# Patient Record
Sex: Female | Born: 2000 | Race: Black or African American | Hispanic: No | Marital: Single | Smoking: Never smoker
Health system: Southern US, Community
[De-identification: ages and names within clinical notes are randomized; demographics above are authoritative.]

## PROBLEM LIST (undated history)

## (undated) DIAGNOSIS — R569 Unspecified convulsions: Secondary | ICD-10-CM

## (undated) DIAGNOSIS — J45909 Unspecified asthma, uncomplicated: Secondary | ICD-10-CM

## (undated) DIAGNOSIS — B009 Herpesviral infection, unspecified: Secondary | ICD-10-CM

## (undated) HISTORY — DX: Herpesviral infection, unspecified: B00.9

---

## 2019-01-20 DIAGNOSIS — Z9289 Personal history of other medical treatment: Secondary | ICD-10-CM | POA: Insufficient documentation

## 2021-04-10 ENCOUNTER — Emergency Department: Payer: Medicaid Other | Admitting: Anesthesiology

## 2021-04-10 ENCOUNTER — Encounter
Admission: EM | Disposition: A | Discharge status: Home / Self Care | Payer: Self-pay | Source: Home / Self Care | Attending: Emergency Medicine

## 2021-04-10 ENCOUNTER — Encounter: Payer: Self-pay | Admitting: Anesthesiology

## 2021-04-10 ENCOUNTER — Emergency Department: Payer: Medicaid Other

## 2021-04-10 ENCOUNTER — Encounter: Payer: Self-pay | Admitting: Emergency Medicine

## 2021-04-10 ENCOUNTER — Other Ambulatory Visit: Payer: Self-pay

## 2021-04-10 ENCOUNTER — Ambulatory Visit
Admission: EM | Admit: 2021-04-10 | Discharge: 2021-04-10 | Disposition: A | Discharge status: Home / Self Care | Payer: Medicaid Other | Attending: Emergency Medicine | Admitting: Emergency Medicine

## 2021-04-10 DIAGNOSIS — K661 Hemoperitoneum: Secondary | ICD-10-CM | POA: Diagnosis not present

## 2021-04-10 DIAGNOSIS — R1030 Lower abdominal pain, unspecified: Secondary | ICD-10-CM | POA: Diagnosis present

## 2021-04-10 DIAGNOSIS — O00109 Unspecified tubal pregnancy without intrauterine pregnancy: Secondary | ICD-10-CM | POA: Diagnosis present

## 2021-04-10 DIAGNOSIS — R102 Pelvic and perineal pain: Secondary | ICD-10-CM

## 2021-04-10 DIAGNOSIS — O00102 Left tubal pregnancy without intrauterine pregnancy: Secondary | ICD-10-CM | POA: Diagnosis not present

## 2021-04-10 DIAGNOSIS — Z20822 Contact with and (suspected) exposure to covid-19: Secondary | ICD-10-CM | POA: Diagnosis not present

## 2021-04-10 DIAGNOSIS — O00202 Left ovarian pregnancy without intrauterine pregnancy: Secondary | ICD-10-CM

## 2021-04-10 DIAGNOSIS — N838 Other noninflammatory disorders of ovary, fallopian tube and broad ligament: Secondary | ICD-10-CM | POA: Insufficient documentation

## 2021-04-10 DIAGNOSIS — O009 Unspecified ectopic pregnancy without intrauterine pregnancy: Secondary | ICD-10-CM

## 2021-04-10 DIAGNOSIS — Z8719 Personal history of other diseases of the digestive system: Secondary | ICD-10-CM

## 2021-04-10 HISTORY — DX: Unspecified convulsions: R56.9

## 2021-04-10 HISTORY — DX: Unspecified asthma, uncomplicated: J45.909

## 2021-04-10 HISTORY — PX: DIAGNOSTIC LAPAROSCOPY WITH REMOVAL OF ECTOPIC PREGNANCY: SHX6449

## 2021-04-10 HISTORY — DX: Personal history of other diseases of the digestive system: Z87.19

## 2021-04-10 LAB — CBC
HCT: 30 % — ABNORMAL LOW (ref 36.0–46.0)
Hemoglobin: 8.8 g/dL — ABNORMAL LOW (ref 12.0–15.0)
MCH: 22.1 pg — ABNORMAL LOW (ref 26.0–34.0)
MCHC: 29.3 g/dL — ABNORMAL LOW (ref 30.0–36.0)
MCV: 75.2 fL — ABNORMAL LOW (ref 80.0–100.0)
Platelets: 290 10*3/uL (ref 150–400)
RBC: 3.99 MIL/uL (ref 3.87–5.11)
RDW: 17.6 % — ABNORMAL HIGH (ref 11.5–15.5)
WBC: 8.4 10*3/uL (ref 4.0–10.5)
nRBC: 0 % (ref 0.0–0.2)

## 2021-04-10 LAB — BASIC METABOLIC PANEL
Anion gap: 5 (ref 5–15)
BUN: 10 mg/dL (ref 6–20)
CO2: 23 mmol/L (ref 22–32)
Calcium: 9.2 mg/dL (ref 8.9–10.3)
Chloride: 105 mmol/L (ref 98–111)
Creatinine, Ser: 0.49 mg/dL (ref 0.44–1.00)
GFR, Estimated: >60 mL/min (ref >60)
Glucose, Bld: 95 mg/dL (ref 70–99)
Potassium: 3.4 mmol/L — ABNORMAL LOW (ref 3.5–5.1)
Sodium: 133 mmol/L — ABNORMAL LOW (ref 135–145)

## 2021-04-10 LAB — POC URINE PREG, ED: Preg Test, Ur: POSITIVE — AB

## 2021-04-10 LAB — ABO/RH: ABO/RH(D): AB POS

## 2021-04-10 LAB — TYPE AND SCREEN
ABO/RH(D): AB POS
Antibody Screen: NEGATIVE

## 2021-04-10 LAB — URINALYSIS, COMPLETE (UACMP) WITH MICROSCOPIC
Bilirubin Urine: NEGATIVE
Glucose, UA: NEGATIVE mg/dL
Hgb urine dipstick: NEGATIVE
Leukocytes,Ua: NEGATIVE
Nitrite: NEGATIVE
Protein, ur: 30 mg/dL — AB
Specific Gravity, Urine: >1.03 — ABNORMAL HIGH (ref 1.005–1.030)
Squamous Epithelial / HPF: >50 (ref 0–5)
pH: 5.5 (ref 5.0–8.0)

## 2021-04-10 LAB — RESP PANEL BY RT-PCR (FLU A&B, COVID) ARPGX2
Influenza A by PCR: NEGATIVE
Influenza B by PCR: NEGATIVE
SARS Coronavirus 2 by RT PCR: NEGATIVE

## 2021-04-10 LAB — HCG, QUANTITATIVE, PREGNANCY: hCG, Beta Chain, Quant, S: 54059 m[IU]/mL — ABNORMAL HIGH (ref <5)

## 2021-04-10 SURGERY — LAPAROSCOPY, WITH ECTOPIC PREGNANCY SURGICAL TREATMENT
Anesthesia: General | Site: Abdomen | Laterality: Left

## 2021-04-10 SURGERY — DILATION AND EVACUATION, UTERUS
Anesthesia: Choice | Laterality: Left

## 2021-04-10 MED ORDER — ACETAMINOPHEN 10 MG/ML IV SOLN: 1000.0000 mg | Freq: Once | INTRAVENOUS | Status: DC | PRN

## 2021-04-10 MED ORDER — BUPIVACAINE HCL 0.5 % IJ SOLN
INTRAMUSCULAR | Status: DC | PRN
  Administered 2021-04-10: 25 mL

## 2021-04-10 MED ORDER — LACTATED RINGERS IV SOLN: INTRAVENOUS | Status: DC

## 2021-04-10 MED ORDER — IBUPROFEN 600 MG PO TABS: 600.0000 mg | ORAL_TABLET | Freq: Four times a day (QID) | ORAL | 1 refills | Status: AC | PRN

## 2021-04-10 MED ORDER — ACETAMINOPHEN 500 MG PO TABS: 1000.0000 mg | ORAL_TABLET | ORAL | Status: AC

## 2021-04-10 MED ORDER — SUGAMMADEX SODIUM 200 MG/2ML IV SOLN
INTRAVENOUS | Status: DC | PRN
  Administered 2021-04-10: 200 mg via INTRAVENOUS

## 2021-04-10 MED ORDER — KETOROLAC TROMETHAMINE 30 MG/ML IJ SOLN
INTRAMUSCULAR | Status: AC
  Filled 2021-04-10: qty 1

## 2021-04-10 MED ORDER — MIDAZOLAM HCL 2 MG/2ML IJ SOLN
INTRAMUSCULAR | Status: AC
  Filled 2021-04-10: qty 2

## 2021-04-10 MED ORDER — ONDANSETRON HCL 4 MG/2ML IJ SOLN: 4.0000 mg | Freq: Once | INTRAMUSCULAR | Status: DC | PRN

## 2021-04-10 MED ORDER — LIDOCAINE HCL (CARDIAC) PF 100 MG/5ML IV SOSY
PREFILLED_SYRINGE | INTRAVENOUS | Status: DC | PRN
Start: 2021-04-10 — End: 2021-04-10
  Administered 2021-04-10: 60 mg via INTRAVENOUS

## 2021-04-10 MED ORDER — HYDROMORPHONE HCL 1 MG/ML IJ SOLN
INTRAMUSCULAR | Status: AC
  Filled 2021-04-10: qty 1

## 2021-04-10 MED ORDER — MIDAZOLAM HCL 2 MG/2ML IJ SOLN
INTRAMUSCULAR | Status: DC | PRN
Start: 2021-04-10 — End: 2021-04-10
  Administered 2021-04-10: 2 mg via INTRAVENOUS

## 2021-04-10 MED ORDER — SODIUM CHLORIDE 0.9 % IV BOLUS
1000.0000 mL | Freq: Once | INTRAVENOUS | Status: AC
  Administered 2021-04-10: 1000 mL via INTRAVENOUS

## 2021-04-10 MED ORDER — FENTANYL CITRATE PF 50 MCG/ML IJ SOSY
50.0000 ug | PREFILLED_SYRINGE | Freq: Once | INTRAMUSCULAR | Status: AC
  Administered 2021-04-10: 50 ug via INTRAVENOUS
  Filled 2021-04-10: qty 1

## 2021-04-10 MED ORDER — FENTANYL CITRATE (PF) 100 MCG/2ML IJ SOLN
INTRAMUSCULAR | Status: AC
  Filled 2021-04-10: qty 2

## 2021-04-10 MED ORDER — DEXAMETHASONE SODIUM PHOSPHATE 10 MG/ML IJ SOLN
INTRAMUSCULAR | Status: DC | PRN
  Administered 2021-04-10: 8 mg via INTRAVENOUS

## 2021-04-10 MED ORDER — DIPHENHYDRAMINE HCL 50 MG/ML IJ SOLN
INTRAMUSCULAR | Status: AC
  Filled 2021-04-10: qty 1

## 2021-04-10 MED ORDER — DEXMEDETOMIDINE HCL IN NACL 200 MCG/50ML IV SOLN
INTRAVENOUS | Status: DC | PRN
  Administered 2021-04-10: 8 ug via INTRAVENOUS

## 2021-04-10 MED ORDER — GABAPENTIN 300 MG PO CAPS
ORAL_CAPSULE | ORAL | Status: AC
  Administered 2021-04-10: 300 mg via ORAL
  Filled 2021-04-10: qty 1

## 2021-04-10 MED ORDER — LACTATED RINGERS IR SOLN
Status: DC | PRN
  Administered 2021-04-10: 3000 mL

## 2021-04-10 MED ORDER — GABAPENTIN 300 MG PO CAPS: 300.0000 mg | ORAL_CAPSULE | ORAL | Status: AC

## 2021-04-10 MED ORDER — ACETAMINOPHEN 500 MG PO TABS
ORAL_TABLET | ORAL | Status: AC
  Administered 2021-04-10: 500 mg via ORAL
  Filled 2021-04-10: qty 2

## 2021-04-10 MED ORDER — ALBUTEROL SULFATE HFA 108 (90 BASE) MCG/ACT IN AERS
INHALATION_SPRAY | RESPIRATORY_TRACT | Status: DC | PRN
  Administered 2021-04-10: 4 via RESPIRATORY_TRACT

## 2021-04-10 MED ORDER — ONDANSETRON HCL 4 MG/2ML IJ SOLN
INTRAMUSCULAR | Status: DC | PRN
Start: 2021-04-10 — End: 2021-04-10
  Administered 2021-04-10: 4 mg via INTRAVENOUS

## 2021-04-10 MED ORDER — 0.9 % SODIUM CHLORIDE (POUR BTL) OPTIME
TOPICAL | Status: DC | PRN
  Administered 2021-04-10: 500 mL

## 2021-04-10 MED ORDER — ROCURONIUM BROMIDE 100 MG/10ML IV SOLN
INTRAVENOUS | Status: DC | PRN
Start: 2021-04-10 — End: 2021-04-10
  Administered 2021-04-10: 50 mg via INTRAVENOUS

## 2021-04-10 MED ORDER — OXYCODONE HCL 5 MG PO TABS: 5.0000 mg | ORAL_TABLET | Freq: Once | ORAL | Status: AC | PRN

## 2021-04-10 MED ORDER — DIPHENHYDRAMINE HCL 50 MG/ML IJ SOLN
INTRAMUSCULAR | Status: DC | PRN
  Administered 2021-04-10: 25 mg via INTRAVENOUS

## 2021-04-10 MED ORDER — KETOROLAC TROMETHAMINE 30 MG/ML IJ SOLN
INTRAMUSCULAR | Status: DC | PRN
Start: 2021-04-10 — End: 2021-04-10
  Administered 2021-04-10: 30 mg via INTRAVENOUS

## 2021-04-10 MED ORDER — PROPOFOL 10 MG/ML IV BOLUS
INTRAVENOUS | Status: DC | PRN
  Administered 2021-04-10: 200 mg via INTRAVENOUS

## 2021-04-10 MED ORDER — FENTANYL CITRATE (PF) 100 MCG/2ML IJ SOLN
INTRAMUSCULAR | Status: AC
  Administered 2021-04-10: 25 ug via INTRAVENOUS
  Filled 2021-04-10: qty 2

## 2021-04-10 MED ORDER — OXYCODONE HCL 5 MG PO TABS
ORAL_TABLET | ORAL | Status: AC
  Administered 2021-04-10: 5 mg via ORAL
  Filled 2021-04-10: qty 1

## 2021-04-10 MED ORDER — FENTANYL CITRATE (PF) 100 MCG/2ML IJ SOLN
INTRAMUSCULAR | Status: DC | PRN
  Administered 2021-04-10 (×2): 50 ug via INTRAVENOUS

## 2021-04-10 MED ORDER — OXYCODONE-ACETAMINOPHEN 5-325 MG PO TABS
1.0000 | ORAL_TABLET | Freq: Four times a day (QID) | ORAL | 0 refills | Status: DC | PRN
Start: 2021-04-10 — End: 2021-06-28

## 2021-04-10 MED ORDER — PHENYLEPHRINE HCL (PRESSORS) 10 MG/ML IV SOLN
INTRAVENOUS | Status: DC | PRN
  Administered 2021-04-10: 160 ug via INTRAVENOUS

## 2021-04-10 MED ORDER — OXYCODONE HCL 5 MG/5ML PO SOLN: 5.0000 mg | Freq: Once | ORAL | Status: AC | PRN

## 2021-04-10 MED ORDER — HYDROMORPHONE HCL 1 MG/ML IJ SOLN
INTRAMUSCULAR | Status: DC | PRN
  Administered 2021-04-10: .25 mg via INTRAVENOUS

## 2021-04-10 MED ORDER — ALBUTEROL SULFATE HFA 108 (90 BASE) MCG/ACT IN AERS
INHALATION_SPRAY | RESPIRATORY_TRACT | Status: AC
  Filled 2021-04-10: qty 6.7

## 2021-04-10 MED ORDER — FENTANYL CITRATE (PF) 100 MCG/2ML IJ SOLN
25.0000 ug | INTRAMUSCULAR | Status: DC | PRN
  Administered 2021-04-10: 25 ug via INTRAVENOUS

## 2021-04-10 SURGICAL SUPPLY — 53 items
ADH SKN CLS APL DERMABOND .7 (GAUZE/BANDAGES/DRESSINGS) ×1
ANCHOR TIS RET SYS 235ML (MISCELLANEOUS) ×1 IMPLANT
APL PRP STRL LF DISP 70% ISPRP (MISCELLANEOUS) ×1
BACTOSHIELD CHG 4% 4OZ (MISCELLANEOUS) ×1
BAG SPEC RTRVL LRG 6X4 10 (ENDOMECHANICALS)
BAG TISS RTRVL C235 10X14 (MISCELLANEOUS) ×1
BLADE SURG SZ11 CARB STEEL (BLADE) ×2 IMPLANT
CATH FOLEY SIL 2WAY 14FR5CC (CATHETERS) ×1 IMPLANT
CATH ROBINSON RED A/P 16FR (CATHETERS) ×1 IMPLANT
CHLORAPREP W/TINT 26 (MISCELLANEOUS) ×2 IMPLANT
CORD MONOPOLAR M/FML 12FT (MISCELLANEOUS) IMPLANT
DERMABOND ADVANCED (GAUZE/BANDAGES/DRESSINGS) ×1
DERMABOND ADVANCED .7 DNX12 (GAUZE/BANDAGES/DRESSINGS) ×1 IMPLANT
GAUZE 4X4 16PLY ~~LOC~~+RFID DBL (SPONGE) ×3 IMPLANT
GLOVE SURG ENC MOIS LTX SZ6.5 (GLOVE) ×1 IMPLANT
GLOVE SURG ENC MOIS LTX SZ8 (GLOVE) ×1 IMPLANT
GLOVE SURG SYN 6.5 ES PF (GLOVE) ×12 IMPLANT
GLOVE SURG SYN 6.5 PF PI (GLOVE) IMPLANT
GLOVE SURG UNDER LTX SZ7 (GLOVE) ×1 IMPLANT
GOWN STRL REUS W/ TWL LRG LVL3 (GOWN DISPOSABLE) ×2 IMPLANT
GOWN STRL REUS W/TWL LRG LVL3 (GOWN DISPOSABLE) ×4
GOWN STRL REUS W/TWL XL LVL4 (GOWN DISPOSABLE) ×2 IMPLANT
GRASPER SUT TROCAR 14GX15 (MISCELLANEOUS) ×1 IMPLANT
IRRIGATION STRYKERFLOW (MISCELLANEOUS) ×1 IMPLANT
IRRIGATOR STRYKERFLOW (MISCELLANEOUS) ×2
IV LACTATED RINGERS 1000ML (IV SOLUTION) ×2 IMPLANT
KIT PINK PAD W/HEAD ARE REST (MISCELLANEOUS) ×2 IMPLANT
KIT PINK PAD W/HEAD ARM REST (MISCELLANEOUS) ×1 IMPLANT
KIT TURNOVER CYSTO (KITS) ×2 IMPLANT
MANIFOLD NEPTUNE II (INSTRUMENTS) ×2 IMPLANT
NS IRRIG 500ML POUR BTL (IV SOLUTION) ×2 IMPLANT
PACK GYN LAPAROSCOPIC (MISCELLANEOUS) ×2 IMPLANT
PAD OB MATERNITY 4.3X12.25 (PERSONAL CARE ITEMS) ×2 IMPLANT
PAD PREP 24X41 OB/GYN DISP (PERSONAL CARE ITEMS) ×2 IMPLANT
POUCH ENDO CATCH 10MM SPEC (MISCELLANEOUS) IMPLANT
POUCH SPECIMEN RETRIEVAL 10MM (ENDOMECHANICALS) IMPLANT
SCISSORS METZENBAUM CVD 33 (INSTRUMENTS) IMPLANT
SCRUB CHG 4% DYNA-HEX 4OZ (MISCELLANEOUS) ×1 IMPLANT
SET TUBE SMOKE EVAC HIGH FLOW (TUBING) ×2 IMPLANT
SHEARS HARMONIC ACE PLUS 36CM (ENDOMECHANICALS) ×1 IMPLANT
SLEEVE ENDOPATH XCEL 5M (ENDOMECHANICALS) ×2 IMPLANT
SUT MNCRL 4-0 (SUTURE) ×2
SUT MNCRL 4-0 27XMFL (SUTURE) ×1
SUT VIC AB 3-0 SH 27 (SUTURE)
SUT VIC AB 3-0 SH 27X BRD (SUTURE) IMPLANT
SUT VIC AB 4-0 SH 27 (SUTURE) ×2
SUT VIC AB 4-0 SH 27XANBCTRL (SUTURE) IMPLANT
SUT VICRYL 0 AB UR-6 (SUTURE) ×2 IMPLANT
SUTURE MNCRL 4-0 27XMF (SUTURE) IMPLANT
TROCAR ENDO BLADELESS 11MM (ENDOMECHANICALS) ×2 IMPLANT
TROCAR XCEL NON-BLD 5MMX100MML (ENDOMECHANICALS) ×2 IMPLANT
TROCAR XCEL UNIV SLVE 11M 100M (ENDOMECHANICALS) IMPLANT
WATER STERILE IRR 500ML POUR (IV SOLUTION) ×1 IMPLANT

## 2021-04-10 SURGICAL SUPPLY — 27 items
BACTOSHIELD CHG 4% 4OZ (MISCELLANEOUS) ×1
DRSG TELFA 3X8 NADH (GAUZE/BANDAGES/DRESSINGS) ×2 IMPLANT
FILTER UTR ASPR SPEC (MISCELLANEOUS) ×1 IMPLANT
FLTR UTR ASPR SPEC (MISCELLANEOUS) ×2
GAUZE 4X4 16PLY ~~LOC~~+RFID DBL (SPONGE) ×4 IMPLANT
GLOVE SURG ENC MOIS LTX SZ6.5 (GLOVE) ×2 IMPLANT
GLOVE SURG UNDER LTX SZ7 (GLOVE) ×2 IMPLANT
GOWN STRL REUS W/ TWL LRG LVL3 (GOWN DISPOSABLE) ×1 IMPLANT
GOWN STRL REUS W/TWL LRG LVL3 (GOWN DISPOSABLE) ×1
KIT BERKELEY 1ST TRIMESTER 3/8 (MISCELLANEOUS) ×2 IMPLANT
KIT TURNOVER KIT A (KITS) ×2 IMPLANT
MANIFOLD NEPTUNE II (INSTRUMENTS) ×2 IMPLANT
NEEDLE HYPO 25X1 1.5 SAFETY (NEEDLE) ×2 IMPLANT
NEEDLE SPNL 22GX5 LNG QUINC BK (NEEDLE) ×2 IMPLANT
PACK DNC HYST (MISCELLANEOUS) ×2 IMPLANT
PAD OB MATERNITY 4.3X12.25 (PERSONAL CARE ITEMS) ×2 IMPLANT
PAD PREP 24X41 OB/GYN DISP (PERSONAL CARE ITEMS) ×2 IMPLANT
SCRUB CHG 4% DYNA-HEX 4OZ (MISCELLANEOUS) ×1 IMPLANT
SET BERKELEY SUCTION TUBING (SUCTIONS) ×2 IMPLANT
SET CYSTO W/LG BORE CLAMP LF (SET/KITS/TRAYS/PACK) IMPLANT
SOL PREP PVP 2OZ (MISCELLANEOUS) ×2
SOLUTION PREP PVP 2OZ (MISCELLANEOUS) ×1 IMPLANT
SYR 10ML LL (SYRINGE) ×2 IMPLANT
VACURETTE 10 RIGID CVD (CANNULA) IMPLANT
VACURETTE 12 RIGID CVD (CANNULA) IMPLANT
VACURETTE 8 RIGID CVD (CANNULA) IMPLANT
WATER STERILE IRR 500ML POUR (IV SOLUTION) ×2 IMPLANT

## 2021-04-10 NOTE — Consult Note (Addendum)
Reason for Consult: Ectopic pregnancy Referring Physician: Minna Antis, MD (ER Physician)  Vickie Lewis is an 21 y.o. G1P0 female with LMP 02/21/2021, EGA 6.6 weeks, with EDD of 11/28/2021 who presents to the ER with complaints of abdominal pain and back pain since ~ 0730 this morning.  Denies vaginal bleeding. Patient denies nausea or vomiting, fevers or chills.  Sees OB/GYN in [facility]  Pertinent Gynecological History: Menses: regular every month without intermenstrual spotting and usually lasting 4 to 5 days Contraception: none DES exposure: denies Blood transfusions: none Sexually transmitted diseases: no past history Last pap: normal Date: ~ 1-2 years ago.   Menstrual History: Menarche age: 44 Patient's last menstrual period was 02/21/2021.    OB History  Gravida Para Term Preterm AB Living  1            SAB IAB Ectopic Multiple Live Births               # Outcome Date GA Lbr Len/2nd Weight Sex Delivery Anes PTL Lv  1 Current             Past Medical History:  Diagnosis Date   Asthma    Seizures (HCC)     History reviewed. No pertinent surgical history.   Family History  Problem Relation Age of Onset   Hypertension Mother    Hypertension Sister    Diabetes Sister     Social History:  reports that she has never smoked. She has never used smokeless tobacco. She reports that she does not drink alcohol and does not use drugs.  Allergies:  Allergies  Allergen Reactions   Latex Hives   Peanut (Diagnostic) Hives   Penicillins Hives    No current facility-administered medications on file prior to encounter.   No current outpatient medications on file prior to encounter.     Review of Systems  Constitutional: Negative.   HENT: Negative.    Eyes: Negative.   Respiratory: Negative.    Cardiovascular: Negative.   Gastrointestinal:  Positive for abdominal pain. Negative for nausea and vomiting.  Genitourinary: Negative.   Musculoskeletal:  Positive  for back pain. Negative for joint swelling and neck stiffness.  Neurological: Negative.   Hematological: Negative.   Psychiatric/Behavioral: Negative.     Blood pressure 119/72, pulse 93, temperature 98.3 F (36.8 C), temperature source Oral, resp. rate 16, height 5\' 4"  (1.626 m), weight 64.4 kg, last menstrual period 02/21/2021, SpO2 100 %.  Physical Exam Constitutional:      General: She is not in acute distress.    Appearance: She is well-developed and normal weight. She is not ill-appearing.  HENT:     Head: Normocephalic and atraumatic.     Mouth/Throat:     Mouth: Mucous membranes are moist.     Pharynx: Oropharynx is clear.  Eyes:     Extraocular Movements: Extraocular movements intact.     Pupils: Pupils are equal, round, and reactive to light.  Cardiovascular:     Rate and Rhythm: Normal rate and regular rhythm.     Heart sounds: Normal heart sounds. No murmur heard.   No gallop.  Pulmonary:     Effort: Pulmonary effort is normal.     Breath sounds: Normal breath sounds.  Abdominal:     General: Abdomen is flat. Bowel sounds are normal. There is no distension or abdominal bruit.     Palpations: Abdomen is soft. There is no mass.     Tenderness: There is no abdominal tenderness.  Genitourinary:    Comments: Deferred to OR Skin:    General: Skin is warm and dry.  Neurological:     General: No focal deficit present.     Mental Status: She is alert.    Results for orders placed or performed during the hospital encounter of 04/10/21 (from the past 48 hour(s))  CBC     Status: Abnormal   Collection Time: 04/10/21 10:00 AM  Result Value Ref Range   WBC 8.4 4.0 - 10.5 K/uL   RBC 3.99 3.87 - 5.11 MIL/uL   Hemoglobin 8.8 (L) 12.0 - 15.0 g/dL    Comment: Reticulocyte Hemoglobin testing may be clinically indicated, consider ordering this additional test [id number]    HCT 30.0 (L) 36.0 - 46.0 %   MCV 75.2 (L) 80.0 - 100.0 fL   MCH 22.1 (L) 26.0 - 34.0 pg   MCHC 29.3  (L) 30.0 - 36.0 g/dL   RDW 54.017.6 (H) 98.111.5 - 19.115.5 %   Platelets 290 150 - 400 K/uL   nRBC 0.0 0.0 - 0.2 %    Comment: Performed at [facility], [address] [place], [place] [address]  Basic metabolic panel     Status: Abnormal   Collection Time: 04/10/21 10:00 AM  Result Value Ref Range   Sodium 133 (L) 135 - 145 mmol/L   Potassium 3.4 (L) 3.5 - 5.1 mmol/L   Chloride 105 98 - 111 mmol/L   CO2 23 22 - 32 mmol/L   Glucose, Bld 95 70 - 99 mg/dL    Comment: Glucose reference range applies only to samples taken after fasting for at least 8 hours.   BUN 10 6 - 20 mg/dL   Creatinine, Ser 5.620.49 0.44 - 1.00 mg/dL   Calcium 9.2 8.9 - 13.010.3 mg/dL   GFR, Estimated >86>60 >57>60 mL/min    Comment: (NOTE) Calculated using the CKD-EPI Creatinine Equation (2021)    Anion gap 5 5 - 15    Comment: Performed at [facility], [address] [place], [place] [address]  hCG, quantitative, pregnancy     Status: Abnormal   Collection Time: 04/10/21 10:00 AM  Result Value Ref Range   hCG, Beta Chain, Quant, S 54,059 (H) <5 mIU/mL    Comment:          GEST. AGE      CONC.  (mIU/mL)   <=1 WEEK        5 - 50     2 WEEKS       50 - 500     3 WEEKS       100 - 10,000     4 WEEKS     1,000 - 30,000     5 WEEKS     3,500 - 115,000   6-8 WEEKS     12,000 - 270,000    12 WEEKS     15,000 - 220,000        FEMALE AND NON-PREGNANT FEMALE:     LESS THAN 5 mIU/mL Performed at [facility], [address] [place], [place] [address]   ABO/Rh     Status: None   Collection Time: 04/10/21 10:00 AM  Result Value Ref Range   ABO/RH(D)      AB POS Performed at [facility], [address] [place], [place] [address]   Urinalysis, Complete w Microscopic     Status: Abnormal   Collection Time: 04/10/21 10:25 AM  Result Value Ref Range  Color, Urine YELLOW YELLOW   APPearance CLEAR CLEAR   Specific Gravity, Urine >1.030 (H) 1.005 - 1.030   pH 5.5 5.0 - 8.0   Glucose, UA  NEGATIVE NEGATIVE mg/dL   Hgb urine dipstick NEGATIVE NEGATIVE   Bilirubin Urine NEGATIVE NEGATIVE   Ketones, ur TRACE (A) NEGATIVE mg/dL   Protein, ur 30 (A) NEGATIVE mg/dL   Nitrite NEGATIVE NEGATIVE   Leukocytes,Ua NEGATIVE NEGATIVE   Squamous Epithelial / LPF >50 0 - 5   WBC, UA 6-10 0 - 5 WBC/hpf   RBC / HPF 0-5 0 - 5 RBC/hpf   Bacteria, UA RARE (A) NONE SEEN   Mucus PRESENT     Comment: Performed at [facility], [address] [place], [place] [address]  POC Urine Pregnancy, ED     Status: Abnormal   Collection Time: 04/10/21 10:27 AM  Result Value Ref Range   Preg Test, Ur POSITIVE (A) NEGATIVE    Comment:        THE SENSITIVITY OF THIS METHODOLOGY IS >24 mIU/mL   Type and screen [facility]     Status: None (Preliminary result)   Collection Time: 04/10/21 11:34 AM  Result Value Ref Range   ABO/RH(D) PENDING    Antibody Screen PENDING    Sample Expiration      04/13/2021,2359 Performed at [facility], [address] [place], [place] [address]     US OB LESS THAN 14 WEEKS WITH OB TRANSVAGINAL  Result Date: 04/10/2021 CLINICAL DATA:  Pelvic pain EXAM: OBSTETRIC <14 WK [place] AND TRANSVAGINAL OB US TECHNIQUE: Both transabdominal and transvaginal ultrasound examinations were performed for complete evaluation of the gestation as well as the maternal uterus, adnexal regions, and pelvic cul-de-sac. Transvaginal technique was performed to assess early pregnancy. COMPARISON:  None. FINDINGS: Intrauterine gestational sac: None. Yolk sac:  Visualized. Embryo:  Visualized. Cardiac Activity: Visualized. Heart Rate: 132 bpm MSD: 16.9 mm   6 w   4 d CRL:  10.4 mm   7 w   1 d                  [place] EDC: 11/26/2021 Subchorionic hemorrhage:  None visualized. Maternal uterus/adnexae: There is an ectopic pregnancy seen between the uterus and left ovary, likely within the fallopian tube. There is complex appearing free fluid near the left ovary. IMPRESSION:  Ectopic pregnancy in the left adnexa with detectable heartbeat. Adjacent complex appearing free fluid in the pelvis, possibly hemoperitoneum and concerning for rupturing ectopic pregnancy. Critical Value/emergent results were called by telephone at the time of interpretation on 04/10/2021 at 11:29 am to provider [facility] , who verbally acknowledged these results. Electronically Signed   By: Caprice Renshaw M.D.   On: 04/10/2021 11:33    Assessment/Plan: Left ectopic pregnancy    - On exam, she had stable vital signs, no significant guarding or tenderness on abdominal exam. Patient was counseled regarding need for surgical intervention with laparoscopy (salpingostomy vs salpingectomy). Risks of surgery including bleeding which may require transfusion or reoperation, infection, injury to bowel or other surrounding organs, need for additional procedures including (laparoscopy or) laparotomy were explained to patient and written informed consent was obtained.  Patient ate her last meal at 0100, however recently ate a pack of Goldfish ~ 1 hour ago. Advise that she will need to remain NPO for procedure. Anesthesia and OR aware.  Due to urgent nature of the case as there is concern for rupture, will still  proceed despite patient's PO intake status. Preoperative orders and SCDs ordered on call to the OR.  To OR when ready.     Hildred Laser, MD [facility] 04/10/2021

## 2021-04-10 NOTE — ED Notes (Signed)
See triage note  presents wit lower abd pain  pain started 1-2 days ago  no fever and denies any vaginal bleeding  pt is approx [redacted] weeks pregnant

## 2021-04-10 NOTE — ED Notes (Signed)
Pt transferred from ED40 to ED 30 via stretcher. Pts family asking when pt will go to surgery. This RN explained that this is based on the last time pt ate/drank & the OR case load. This RN informed pt & family that once she knows when pt is to go to the OR, she will update them. Family v/u & agrees with plan.

## 2021-04-10 NOTE — ED Provider Notes (Signed)
[facility] Provider Note    Event Date/Time   First MD Initiated Contact with Patient 04/10/21 1003     (approximate)  History   Chief Complaint: Abdominal Pain  HPI  Vickie Lewis is a 21 y.o. female G1 approximately [redacted] weeks pregnant who presents to the emergency department for lower abdominal and back discomfort.  According to the patient since this morning she has been experiencing some cramping and discomfort in the lower abdomen and lower back.  Patient states she is approximately [redacted] weeks pregnant, has seen a doctor for confirmatory pregnancy test but has not had an ultrasound yet.  Patient denies any vaginal bleeding.  States some nausea but denies any vomiting.  Physical Exam   Triage Vital Signs: ED Triage Vitals  Enc Vitals Group     BP 04/10/21 0957 123/77     Pulse Rate 04/10/21 0957 87     Resp 04/10/21 0957 18     Temp 04/10/21 0957 98.3 F (36.8 C)     Temp Source 04/10/21 0957 Oral     SpO2 04/10/21 0957 99 %     Weight 04/10/21 0949 142 lb (64.4 kg)     Height 04/10/21 0949 5\' 4"  (1.626 m)     Head Circumference --      Peak Flow --      Pain Score 04/10/21 0949 7     Pain Loc --      Pain Edu? --      Excl. in GC? --     Most recent vital signs: Vitals:   04/10/21 0957  BP: 123/77  Pulse: 87  Resp: 18  Temp: 98.3 F (36.8 C)  SpO2: 99%    General: Awake, no distress.  CV:  Good peripheral perfusion.  Regular rate and rhythm  Resp:  Normal effort.  Equal breath sounds bilaterally.  Abd:  Soft, mild suprapubic tenderness otherwise benign abdomen.    ED Results / Procedures / Treatments   RADIOLOGY  Radiology has called me concerning ultrasound showing ectopic pregnancy with likely rupture in the left adnexa free fluid visible in the pelvis likely hemoperitoneum.   MEDICATIONS ORDERED IN ED: Medications - No data to display   IMPRESSION / MDM / ASSESSMENT AND PLAN / ED COURSE  I reviewed the triage vital signs  and the nursing notes.  Patient presents emergency department for lower abdominal and back discomfort starting this morning.  No acute findings on my evaluation.  Slight suprapubic tenderness.  Reassuring physical exam.  Reassuring vitals.  Patient denies vaginal bleeding.  Differential would include miscarriage, UTI, round ligament, constipation.  We will check labs, obtain an ultrasound and continue to closely monitor.  Patient agreeable plan of care.  Patient's ultrasound unfortunately is consistent with what appears to be a ruptured ectopic pregnancy with hemoperitoneum.  Patient's vital signs are reassuring.  Patient's hemoglobin is low at 8.8, however no old labs for comparison.  Mom states the patient has a history of chronic anemia supposed be taking iron supplements but does not do so.  Patient's chemistry is normal with a normal creatinine.  Beta-hCG is resulted at 54,000.  Urine is nonrevealing.  We will discuss with OB for further management.  I have sent a type and screen we will IV hydrate and treat pain while awaiting OB.  I spoke to Dr. 06/08/21 who will be taking the patient to the operating room for definitive care.  Patient agreeable to plan of care.  FINAL CLINICAL IMPRESSION(S) / ED DIAGNOSES   Ruptured ectopic pregnancy   Note:  This document was prepared using Dragon voice recognition software and may include unintentional dictation errors.   Minna Antis, MD 04/10/21 1231

## 2021-04-10 NOTE — Discharge Instructions (Signed)

## 2021-04-10 NOTE — ED Triage Notes (Signed)
Pt via POV from home. Pt c/o lower abd pain that started this AM. Denies any vaginal bleeding. Denies NVD. Pt's 1st pregnancy. Pregnancy has been confirmed by a doctor. Pt is A&Ox4 and NAD.

## 2021-04-10 NOTE — Transfer of Care (Signed)
Immediate Anesthesia Transfer of Care Note  Patient: Vickie Lewis  Procedure(s) Performed: DIAGNOSTIC LAPAROSCOPY WITH REMOVAL OF ECTOPIC PREGNANCY (Left: Abdomen)  Patient Location: PACU  Anesthesia Type:General  Level of Consciousness: drowsy  Airway & Oxygen Therapy: Patient Spontanous Breathing  Post-op Assessment: Report given to RN and Post -op Vital signs reviewed and stable  Post vital signs: Reviewed and stable  Last Vitals:  Vitals Value Taken Time  BP 128/66 04/10/21 1748  Temp 36.3 C 04/10/21 1748  Pulse 86 04/10/21 1753  Resp 20 04/10/21 1753  SpO2 100 % 04/10/21 1753  Vitals shown include unvalidated device data.  Last Pain:  Vitals:   04/10/21 1748  TempSrc:   PainSc: Asleep         Complications: No notable events documented.

## 2021-04-10 NOTE — Anesthesia Preprocedure Evaluation (Deleted)
Anesthesia Evaluation    Airway        Dental   Pulmonary asthma ,           Cardiovascular      Neuro/Psych Seizures -,     GI/Hepatic   Endo/Other    Renal/GU      Musculoskeletal   Abdominal   Peds  Hematology   Anesthesia Other Findings   Reproductive/Obstetrics                             Anesthesia Physical Anesthesia Plan  ASA: 2 and emergent  Anesthesia Plan: General   Post-op Pain Management:    Induction: Intravenous and Rapid sequence  PONV Risk Score and Plan: 3 and Ondansetron, Dexamethasone and Treatment may vary due to age or medical condition  Airway Management Planned: Oral ETT  Additional Equipment:   Intra-op Plan:   Post-operative Plan: Extubation in OR  Informed Consent: I have reviewed the patients History and Physical, chart, labs and discussed the procedure including the risks, benefits and alternatives for the proposed anesthesia with the patient or authorized representative who has indicated his/her understanding and acceptance.     Dental advisory given  Plan Discussed with: CRNA  Anesthesia Plan Comments: (Patient consented for risks of anesthesia including but not limited to:  - adverse reactions to medications - damage to eyes, teeth, lips or other oral mucosa - nerve damage due to positioning  - sore throat or hoarseness - damage to heart, brain, nerves, lungs, other parts of body or loss of life  Informed patient about role of CRNA in peri- and intra-operative care.  Patient voiced understanding.)        Anesthesia Quick Evaluation

## 2021-04-10 NOTE — ED Notes (Addendum)
Pt last meal was at 0100 today per mother.

## 2021-04-10 NOTE — ED Notes (Signed)
This RN received verbal report from [place], Therapist, sports.

## 2021-04-10 NOTE — Anesthesia Preprocedure Evaluation (Addendum)
Anesthesia Evaluation  Patient identified by MRN, date of birth, ID band Patient awake    Reviewed: Allergy & Precautions, NPO status , Patient's Chart, lab work & pertinent test results  History of Anesthesia Complications Negative for: history of anesthetic complications  Airway Mallampati: III   Neck ROM: Full    Dental no notable dental hx.    Pulmonary asthma ,    Pulmonary exam normal breath sounds clear to auscultation       Cardiovascular Exercise Tolerance: Good negative cardio ROS Normal cardiovascular exam Rhythm:Regular Rate:Normal     Neuro/Psych Seizures -, Well Controlled,     GI/Hepatic negative GI ROS,   Endo/Other  negative endocrine ROS  Renal/GU negative Renal ROS     Musculoskeletal   Abdominal   Peds  Hematology negative hematology ROS (+)   Anesthesia Other Findings   Reproductive/Obstetrics                            Anesthesia Physical  Anesthesia Plan  ASA: 2 and emergent  Anesthesia Plan: General   Post-op Pain Management:    Induction: Intravenous and Rapid sequence  PONV Risk Score and Plan: 3 and Ondansetron, Dexamethasone and Treatment may vary due to age or medical condition  Airway Management Planned: Oral ETT  Additional Equipment:   Intra-op Plan:   Post-operative Plan: Extubation in OR  Informed Consent: I have reviewed the patients History and Physical, chart, labs and discussed the procedure including the risks, benefits and alternatives for the proposed anesthesia with the patient or authorized representative who has indicated his/her understanding and acceptance.     Dental advisory given  Plan Discussed with: CRNA  Anesthesia Plan Comments: (Patient consented for risks of anesthesia including but not limited to:  - adverse reactions to medications - damage to eyes, teeth, lips or other oral mucosa - nerve damage due to  positioning  - sore throat or hoarseness - damage to heart, brain, nerves, lungs, other parts of body or loss of life  Informed patient about role of CRNA in peri- and intra-operative care.  Patient voiced understanding.)        Anesthesia Quick Evaluation

## 2021-04-10 NOTE — Op Note (Signed)
Procedure(s): DIAGNOSTIC LAPAROSCOPY WITH REMOVAL OF ECTOPIC PREGNANCY, ADHESIOLYSIS Procedure Note  Vickie Lewis female 21 y.o. 04/10/2021  Indications: The patient is a 21 y.o. G1P0 female at 6.[redacted] weeks gestation with suspected ruptured left ectopic pregnancy, small hemoperitoneum  Pre-operative Diagnosis: suspected ruptured left ectopic pregnancy, hemoperitoneum (small)  Post-operative Diagnosis: left ectopic pregnancy, intact.  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 Small hemoperitoneum  Surgeon: Hildred Laser, MD  Assistants:  Surgical scrub tech.   Anesthesia: General endotracheal anesthesia  Findings:  Filmy adhesions of the small bowel to the anterior abdominal wall and right pelvic side wall.  Few filmy adhesions of the posterior cul-de-sac.  Filmy adhesions bilaterally of the fallopian tubes and ovaries to posterior surface of the uterus. Left ectopic pregnancy, intact. Small hemoperitoneum present.   Procedure Details: The patient was seen in the Holding Room. The risks, benefits, complications, treatment options, and expected outcomes were discussed with the patient.  The patient concurred with the proposed plan, giving informed consent.  The site of surgery properly noted/marked. The patient was taken to the Operating Room, identified as Vickie Lewis and the procedure verified as Procedure(s) (LRB): DIAGNOSTIC LAPAROSCOPY WITH REMOVAL OF ECTOPIC PREGNANCY (Left). A Time Out was held and the above information confirmed.  She was then placed under general anesthesia without difficulty. She was placed in the dorsal lithotomy position, and was prepped and draped in a sterile manner.  A straight catheterization was performed. A sterile speculum was inserted into the vagina and the cervix was grasped at the anterior lip using a single-toothed tenaculum.  The uterus was sounded to 9 cm, and a Hulka clamp was placed for uterine manipulation.  The  speculum and tenaculum were then removed. After an adequate timeout was performed, attention was turned to the abdomen where an umbilical incision was made with the scalpel.  The Optiview 5-mm trocar and sleeve were then advanced without difficulty with the laparoscope under direct visualization into the abdomen. The abdomen was then insufflated with carbon dioxide gas and adequate pneumoperitoneum was obtained. A 5-mm right lower quadrant port and an 11-mm left lower quadrant port were then placed under direct visualization.  A survey of the patient's pelvis and abdomen revealed the findings as above.  The filmy adhesions of the small bowel to the abdominal wall and right pelvic sidewall were lysed using the Harmonic device.  Blunt dissection was used to lyse the filmy adhesions of the fallopian tubes and ovaries from the posterior surface of the uterus.  The mesosalpinx of the left fallopian tube was transected until the tube and the ectopic were freed.  An Endocatch bag was then inserted into the 11 mm trochar and the left fallopian tube and ectopic were removed.  The 11 mm trochar was then removed, and a laparoscopic cone was placed.  Using the Carter-Thomason suture device, the fascia of the 11-mm port site was closed under direct visualization using a 0-Vicryl suture.  A final survey was performed, where good hemostasis was noted on the throughout.  All remaining trocars were removed under direct visualization, and the abdomen which was desufflated.    All skin incisions were closed with 4-0 Vicryl subcuticular stitches. The incisions were injected with a total of 25 ml of 1% Lidocaine. The Hulka clamp was removed from the uterus. The patient tolerated the procedure well.  All instruments, needles, and sponge counts were correct x 2. The patient was taken to the recovery room awake, extubated and in stable condition.  Approximately 30% of total case time was spent performing lysis of adhesions.     Estimated Blood Loss:  50 ml of hemoperitoneum      Drains: straight catheterization prior to procedure with  100 ml of clear urine         Total IV Fluids:  800 ml  Specimens: Left fallopian tube with ectopic pregnancy.          Implants: None         Complications:  None; patient tolerated the procedure well.         Disposition: PACU - hemodynamically stable.         Condition: stable   Hildred Laser, MD [facility]

## 2021-04-10 NOTE — ED Notes (Signed)
Pt & mother updated that the OR will be coming shortly to get pt for surgery.

## 2021-04-10 NOTE — ED Notes (Signed)
Report given to OR RN by this RN.

## 2021-04-10 NOTE — Anesthesia Procedure Notes (Signed)
Procedure Name: Intubation Date/Time: 04/10/2021 4:26 PM Performed by: Henrietta Hoover, CRNA Pre-anesthesia Checklist: Patient identified, Emergency Drugs available, Suction available and Patient being monitored Patient Re-evaluated:Patient Re-evaluated prior to induction Oxygen Delivery Method: Circle system utilized Preoxygenation: Pre-oxygenation with 100% oxygen Induction Type: IV induction and Cricoid Pressure applied Ventilation: Mask ventilation without difficulty Laryngoscope Size: 3 and McGraph Grade View: Grade I Tube type: Oral Tube size: 6.5 mm Number of attempts: 1 Airway Equipment and Method: Stylet and Video-laryngoscopy Placement Confirmation: ETT inserted through vocal cords under direct vision, positive ETCO2 and breath sounds checked- equal and bilateral Secured at: 20 cm Tube secured with: Tape Dental Injury: Teeth and Oropharynx as per pre-operative assessment

## 2021-04-11 ENCOUNTER — Encounter: Payer: Self-pay | Admitting: Obstetrics and Gynecology

## 2021-04-11 ENCOUNTER — Telehealth: Payer: Self-pay | Admitting: Obstetrics and Gynecology

## 2021-04-11 NOTE — Telephone Encounter (Signed)
Incoming call from pt's mother, Bobbye Riggs, asking if the pt is okay to lay on her side post procedure yesterday (removal of ectopic pregnancy); along w/ concerns of upper stomach soreness (6 on the pain scale).  Pain medication has since been taken (percocet taken @ 9 am; Ibuprofen is too large to swallow, so unable to switch between the two).   Also, the pt is requesting a copy of the original U/S be loaded into her MyChart, please.  Thank you

## 2021-04-11 NOTE — Anesthesia Postprocedure Evaluation (Signed)
Anesthesia Post Note  Patient: Vickie Lewis  Procedure(s) Performed: DIAGNOSTIC LAPAROSCOPY WITH REMOVAL OF ECTOPIC PREGNANCY (Left: Abdomen)  Patient location during evaluation: PACU Anesthesia Type: General Level of consciousness: awake and alert Pain management: pain level controlled Vital Signs Assessment: post-procedure vital signs reviewed and stable Respiratory status: spontaneous breathing, nonlabored ventilation, respiratory function stable and patient connected to nasal cannula oxygen Cardiovascular status: blood pressure returned to baseline and stable Postop Assessment: no apparent nausea or vomiting Anesthetic complications: no   No notable events documented.   Last Vitals:  Vitals:   04/10/21 1830 04/10/21 1841  BP: 114/72 122/80  Pulse: 63 69  Resp: 12 14  Temp: (!) 36.4 C (!) 36.3 C  SpO2: 100% 100%    Last Pain:  Vitals:   04/10/21 1841  TempSrc: Temporal  PainSc: 6                  Yevette Edwards

## 2021-04-11 NOTE — Telephone Encounter (Signed)
Patient is able to lay on either side as long as it is comfortable. She will have abdominal soreness for several days. If she is beginning to feel tightness or bloated (like indigestion) can take Gas-X or drink carbonated beverages to help burp up the retained gas.  She can break or crush the ibuprofen and take it, or can get the over the counter tablets which are usually smaller, and take 3 tablets every 6 hours.   Her ultrasound images are automatically available in Mychart once they are read by the Radiologist. She should already have access.

## 2021-04-11 NOTE — Telephone Encounter (Signed)
Next opening is not until 22nd Is this ok?

## 2021-04-11 NOTE — Telephone Encounter (Signed)
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 Please Advise.

## 2021-04-12 LAB — SURGICAL PATHOLOGY

## 2021-04-12 NOTE — Telephone Encounter (Signed)
Called patient. Unable to reach patient left VM and Mychart message in regards to patient's concerns.

## 2021-04-15 ENCOUNTER — Other Ambulatory Visit: Payer: Self-pay

## 2021-04-15 ENCOUNTER — Encounter: Payer: Self-pay | Admitting: Emergency Medicine

## 2021-04-15 ENCOUNTER — Emergency Department
Admission: EM | Admit: 2021-04-15 | Discharge: 2021-04-15 | Disposition: A | Discharge status: Home / Self Care | Payer: Medicaid Other | Attending: Emergency Medicine | Admitting: Emergency Medicine

## 2021-04-15 ENCOUNTER — Emergency Department: Payer: Medicaid Other

## 2021-04-15 DIAGNOSIS — G8918 Other acute postprocedural pain: Secondary | ICD-10-CM | POA: Insufficient documentation

## 2021-04-15 DIAGNOSIS — R102 Pelvic and perineal pain: Secondary | ICD-10-CM | POA: Insufficient documentation

## 2021-04-15 DIAGNOSIS — N99820 Postprocedural hemorrhage and hematoma of a genitourinary system organ or structure following a genitourinary system procedure: Secondary | ICD-10-CM | POA: Diagnosis not present

## 2021-04-15 DIAGNOSIS — M549 Dorsalgia, unspecified: Secondary | ICD-10-CM | POA: Insufficient documentation

## 2021-04-15 DIAGNOSIS — Z9889 Other specified postprocedural states: Secondary | ICD-10-CM

## 2021-04-15 DIAGNOSIS — N939 Abnormal uterine and vaginal bleeding, unspecified: Secondary | ICD-10-CM

## 2021-04-15 LAB — URINALYSIS, ROUTINE W REFLEX MICROSCOPIC
Bilirubin Urine: NEGATIVE
Glucose, UA: NEGATIVE mg/dL
Ketones, ur: NEGATIVE mg/dL
Nitrite: NEGATIVE
Protein, ur: 100 mg/dL — AB
RBC / HPF: >50 RBC/hpf — ABNORMAL HIGH (ref 0–5)
Specific Gravity, Urine: 1.019 (ref 1.005–1.030)
pH: 5 (ref 5.0–8.0)

## 2021-04-15 LAB — COMPREHENSIVE METABOLIC PANEL
ALT: 24 U/L (ref 0–44)
AST: 24 U/L (ref 15–41)
Albumin: 3.3 g/dL — ABNORMAL LOW (ref 3.5–5.0)
Alkaline Phosphatase: 33 U/L — ABNORMAL LOW (ref 38–126)
Anion gap: 5 (ref 5–15)
BUN: 8 mg/dL (ref 6–20)
CO2: 24 mmol/L (ref 22–32)
Calcium: 8.7 mg/dL — ABNORMAL LOW (ref 8.9–10.3)
Chloride: 105 mmol/L (ref 98–111)
Creatinine, Ser: 0.59 mg/dL (ref 0.44–1.00)
GFR, Estimated: >60 mL/min (ref >60)
Glucose, Bld: 94 mg/dL (ref 70–99)
Potassium: 3.5 mmol/L (ref 3.5–5.1)
Sodium: 134 mmol/L — ABNORMAL LOW (ref 135–145)
Total Bilirubin: 0.3 mg/dL (ref 0.3–1.2)
Total Protein: 7 g/dL (ref 6.5–8.1)

## 2021-04-15 LAB — LIPASE, BLOOD: Lipase: 27 U/L (ref 11–51)

## 2021-04-15 LAB — CBC
HCT: 27.7 % — ABNORMAL LOW (ref 36.0–46.0)
Hemoglobin: 8.3 g/dL — ABNORMAL LOW (ref 12.0–15.0)
MCH: 22.7 pg — ABNORMAL LOW (ref 26.0–34.0)
MCHC: 30 g/dL (ref 30.0–36.0)
MCV: 75.7 fL — ABNORMAL LOW (ref 80.0–100.0)
Platelets: 303 10*3/uL (ref 150–400)
RBC: 3.66 MIL/uL — ABNORMAL LOW (ref 3.87–5.11)
RDW: 17.8 % — ABNORMAL HIGH (ref 11.5–15.5)
WBC: 7.6 10*3/uL (ref 4.0–10.5)
nRBC: 0 % (ref 0.0–0.2)

## 2021-04-15 MED ORDER — KETOROLAC TROMETHAMINE 30 MG/ML IJ SOLN
15.0000 mg | INTRAMUSCULAR | Status: AC
  Administered 2021-04-15: 15 mg via INTRAMUSCULAR
  Filled 2021-04-15: qty 1

## 2021-04-15 NOTE — Discharge Instructions (Signed)
Your lab tests and pelvic ultrasound today were unremarkable.  Continue monitoring your bleeding and follow-up with Dr. Valentino Saxon this week if symptoms have not resolved in the next 4 days.

## 2021-04-15 NOTE — ED Provider Notes (Addendum)
[facility] Provider Note    Event Date/Time   First MD Initiated Contact with Patient 04/15/21 1006     (approximate)   History   Abdominal Pain   HPI  Vickie Lewis is a 21 y.o. female with a past history of ectopic pregnancy, undergoing laparoscopic salpingo-oophorectomy 5 days ago, who comes ED complaining of pelvic pain radiating to the back with vaginal bleeding this been going on since last night.  Feels like menstrual cramps, moderate intensity, no aggravating  factors.  Improved by taking ibuprofen and oxycodone.  No fevers or chills, no vomiting or diarrhea.  Reviewed operative summary from Dr. Elza Rafter, gynecology, noting successful laparoscopic removal of nonruptured ectopic pregnancy.  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 --      Excl. in GC? --     Most recent vital signs: Vitals:   04/15/21 0851 04/15/21 1100  BP: 128/78 115/70  Pulse: (!) 105 80  Resp: 18 20  Temp: 98.9 F (37.2 C) 99.4 F (37.4 C)  SpO2: 100% 100%     General: Awake, no distress.  CV:  Good peripheral perfusion.  Normal radial pulse Resp:  Normal effort.  Abd:  No distention.  Soft with suprapubic and left lower quadrant tenderness.  Surgical incisions are healing well without inflammatory changes.  No abdominal wall swelling.  No peritoneal signs Other:  No lower extremity edema   ED Results / Procedures / Treatments   Labs (all labs ordered are listed,  but only abnormal results are displayed) Labs Reviewed  COMPREHENSIVE METABOLIC PANEL - Abnormal; Notable for the following components:      Result Value   Sodium 134 (*)    Calcium 8.7 (*)    Albumin 3.3 (*)    Alkaline Phosphatase 33 (*)    All other components within normal limits  CBC - Abnormal; Notable for the following components:   RBC 3.66 (*)    Hemoglobin 8.3 (*)    HCT 27.7 (*)    MCV 75.7 (*)    MCH 22.7 (*)    RDW 17.8 (*)    All other components within normal limits  URINALYSIS, ROUTINE W REFLEX MICROSCOPIC - Abnormal; Notable for the following components:   Color, Urine AMBER (*)    APPearance HAZY (*)    Hgb urine dipstick LARGE (*)    Protein, ur 100 (*)    Leukocytes,Ua SMALL (*)    RBC / HPF >50 (*)    Bacteria, UA RARE (*)    All other components within normal limits  LIPASE, BLOOD     EKG     RADIOLOGY Pelvic ultrasound viewed and interpreted by me,  no apparent fluid collection.  Radiology report reviewed    PROCEDURES:  Critical Care performed: No  Procedures   MEDICATIONS ORDERED IN ED: Medications  ketorolac (TORADOL) 30 MG/ML injection 15 mg (15 mg Intramuscular Given 04/15/21 1050)     IMPRESSION / MDM / ASSESSMENT AND PLAN / ED COURSE  I reviewed the triage vital signs and the nursing notes.                              Differential diagnosis includes, but is not limited to, pelvic abscess, menstrual cycle    Patient presents with cramping pelvic pain and vaginal bleeding, most likely a menstrual cycle precipitated by the surgical removal of her pregnancy and hormone withdrawal bleed.  I will obtain an ultrasound today to evaluate for surgical complication such as pelvic abscess.  She is nontoxic and stable for discharge if ultrasound is okay.  Labs are reassuring and stable.  ----------------------------------------- 12:58 PM on 04/15/2021 ----------------------------------------- Pain improved.  Ultrasound unremarkable.   Labs stable.  Does not require admission due to absence of any apparent surgical complication symptom improvement.     FINAL CLINICAL IMPRESSION(S) / ED DIAGNOSES   Final diagnoses:  Pelvic pain in female  Vaginal bleeding     Rx / DC Orders   ED Discharge Orders     None        Note:  This document was prepared using Dragon voice recognition software and may include unintentional dictation errors.   Sharman Cheek, MD 04/15/21 1257    Sharman Cheek, MD 04/15/21 1258

## 2021-04-15 NOTE — ED Triage Notes (Signed)
Pt via POV from home. Pt c/o lower abd pain and back pain. Pt also having vaginal bleeding. Denies NVD. Pt states blood is bright red. States that this began this AM. Pt is A&OX4 and NAD. Pt was seen on 2/6 for a ruptured ectopic pregnancy and had a D&C done.

## 2021-04-15 NOTE — ED Notes (Signed)
To US via stretcher.

## 2021-04-15 NOTE — ED Notes (Addendum)
Pt alert, NAD, calm, interactive, scrolling phone, mother at [facility]. Waiting on [place]. Denies nausea, sob, dizziness, light headedness. Verbalizes has been bleeding since surgery for ectopic, worse in last 24 hrs, 2 pads in last hour, 5 pads in last 24 hrs. CBIR.

## 2021-04-15 NOTE — ED Notes (Signed)
Back from US, alert, NAD, calm, interactive.  

## 2021-04-21 NOTE — Progress Notes (Unsigned)
° ° °  OBSTETRICS/GYNECOLOGY POST-OPERATIVE CLINIC VISIT  Subjective:     Gracious Anfinson is a 21 y.o. female who presents to the clinic 3 weeks status post  Diagnostic Lapraroscopy with removal of ectopic pregnancy  for  ectopic pregnancy, tubal . Eating a regular diet without difficulty. Bowel movements are normal. Pain is controlled without any medications. She has a burning sensation on lher left side when she coughs or sneezes.   The following portions of the patient's history were reviewed and updated as appropriate: allergies, current medications, past family history, past medical history, past social history, past surgical history, and problem list.  Review of Systems Pertinent items noted in HPI and remainder of comprehensive ROS otherwise negative.   Objective:   There were no vitals taken for this visit. There is no height or weight on file to calculate BMI.  General:  alert and no distress  Abdomen: soft, bowel sounds active, non-tender  Incision:   {incision:13716::"no dehiscence","incision well approximated","healing well","no drainage","no erythema","no hernia","no seroma","no swelling"}    Pathology:    Assessment:   Patient s/p Diagnostic Laparoscopy with Removal of Ectopic Pregnancy (surgery)  Doing well postoperatively.   Plan:   1. Continue any current medications as instructed by provider. 2. Wound care discussed. 3. Operative findings again reviewed. Pathology report discussed. 4. Activity restrictions: {restrictions:13723} 5. Anticipated return to work: {work return:14002}. 6. Follow up: {9-39:03009} {time; units:18646} for ***   Hildred Laser, MD [facility]

## 2021-04-26 ENCOUNTER — Other Ambulatory Visit: Payer: Self-pay

## 2021-04-26 ENCOUNTER — Ambulatory Visit: Payer: Medicaid Other | Admitting: Obstetrics and Gynecology

## 2021-04-27 ENCOUNTER — Ambulatory Visit (INDEPENDENT_AMBULATORY_CARE_PROVIDER_SITE_OTHER): Payer: Medicaid Other | Admitting: Obstetrics and Gynecology

## 2021-04-27 ENCOUNTER — Encounter: Payer: Self-pay | Admitting: Obstetrics and Gynecology

## 2021-04-27 VITALS — BP 112/72 | HR 84 | Resp 16 | Ht 64.0 in | Wt 141.8 lb

## 2021-04-27 DIAGNOSIS — Z4889 Encounter for other specified surgical aftercare: Secondary | ICD-10-CM

## 2021-04-27 DIAGNOSIS — Z8719 Personal history of other diseases of the digestive system: Secondary | ICD-10-CM

## 2021-04-27 DIAGNOSIS — O00102 Left tubal pregnancy without intrauterine pregnancy: Secondary | ICD-10-CM

## 2021-04-27 DIAGNOSIS — Z8619 Personal history of other infectious and parasitic diseases: Secondary | ICD-10-CM

## 2021-04-27 HISTORY — DX: Personal history of other infectious and parasitic diseases: Z86.19

## 2021-04-27 NOTE — Progress Notes (Signed)
OBSTETRICS/GYNECOLOGY POST-OPERATIVE CLINIC VISIT  Subjective:     Vickie Lewis is a 21 y.o. female who presents to the clinic  1.5  weeks status post laparoscopic left salpingectomy for ectopic pregnancy with hemoperitoneum, performed 04/10/2021. Eating a regular diet without difficulty. Bowel movements are normal. Pain is controlled with current analgesics. Medications being used: prescription NSAID's including ibuprofen (Motrin). She does have concerns about a burning sensation she feels on her left side when she coughs or sneezes.   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  She  has a past surgical history that includes Diagnostic laparoscopy with removal of ectopic pregnancy (Left, 04/10/2021).  Her family history includes Diabetes in her sister; Hypertension in her mother and sister.  She  reports that she has never smoked. She has never used smokeless tobacco. She reports that she does not drink alcohol and does not use drugs.  She has a current medication list which includes the following prescription(s): ibuprofen and oxycodone-acetaminophen. She is allergic to other, penicillins, latex, and peanut (diagnostic)..   Review of Systems Pertinent items noted in HPI and remainder of comprehensive ROS otherwise negative.   Objective:   BP 112/72    Pulse 84    Resp 16    Ht 5\' 4"  (1.626 m)    Wt 141 lb 12.8 oz (64.3 kg)    BMI 24.34 kg/m  Body mass index is 24.34 kg/m.  General:  alert and no distress  Abdomen: soft, bowel sounds active, non-tender  Incision:   healing well, no drainage, no erythema, no hernia, no seroma, no swelling, no dehiscence, incision well approximated    Pathology:   SURGICAL PATHOLOGY  CASE: [id number]  PATIENT: Vickie Lewis  Surgical Pathology Report      Specimen Submitted:  A. Fallopian  tube, left, ectopic pregnancy   Clinical History: Ectopic pregnancy       DIAGNOSIS:  A.  FALLOPIAN TUBE WITH ECTOPIC PREGNANCY, LEFT; UNILATERAL  SALPINGECTOMY:  - FALLOPIAN TUBE WITH INTRALUMINAL CHORIONIC VILLI, EXTRAVILLOUS  TROPHOBLAST, AND FETAL TISSUE CONSISTENT WITH ECTOPIC PREGNANCY.  - BENIGN PARATUBAL CYST.  - UNREMARKABLE FIMBRIA.   Assessment:   Patient s/p left salpingectomy with removal of ectopic Doing well postoperatively. Fitz-Hugh Curtis syndrome  Plan:   1. Continue any current medications as instructed by provider.  Advised the patient was able to use any topical antisecretory agents as desired once the Dermabond has completely come off. 2. Wound care discussed. 3. Operative findings again reviewed. Pathology report discussed.  Reviewed findings of Fitz-Hugh-Curtis syndrome possible causes.  Patient initially denied on presentation to the emergency room that she had no prior history of infections, however today does report a history of chlamydia several years ago.  Also has questionable history of concerns for Crohn's disease however at this time denies any issues with her bowels.  Reviewed effects of fertility based on findings.  Patient notes that she is not currently desiring to conceive again anytime soon, however would like to know her chances of having another possible ectopic based on new information presented.  I discussed that the best way to assess tubal patency of her remaining tube would be to perform an HSG.  Patient notes understanding.  Can schedule at her convenience once her cycles return.   4. Activity restrictions: none. Can return to normal activity by next week.  5.  Anticipated return to work: now. 6. Follow up:  2-3 months for annual exam. Desires to establish care  at [facility].     Rubie Maid, MD [facility]

## 2021-05-05 ENCOUNTER — Encounter: Payer: Self-pay | Admitting: Obstetrics and Gynecology

## 2021-06-26 NOTE — Progress Notes (Signed)
? ? ?GYNECOLOGY ANNUAL PHYSICAL EXAM PROGRESS NOTE ? ?Subjective:  ? ? Vickie Lewis is a 21 y.o. G51P0010 female who presents for an annual exam.  The patient is not currently sexually active (last active in January). The patient participates in regular exercise: no. Has the patient ever been transfused or tattooed?: yes. The patient reports that there is not domestic violence in her life.  ? ?The patient has the following complaints today: ?Reports that she has not had a cycle for this month.  Did have a cycle in March.  Had a history of ectopic pregnancy in February status post left salpingectomy.  Is not currently sexually active.  Has not been sexually active since prior to her ectopic. ?Desires to know when she can schedule an HSG to assess her remaining tube. ? ?Menstrual History: ?Menarche age: 28 ?Patient's last menstrual period was 05/16/2021 (exact date). ?Period Duration (Days): 5 ?Period Pattern: Regular ?Menstrual Flow: Moderate ?Menstrual Control: Maxi pad ?Menstrual Control Change Freq (Hours): 3-4 ?Dysmenorrhea: (!) Mild ? ?Gynecologic History:  ?Contraception: none ?History of STI's: Chlamydia ?Last Pap: Patient never had 1 ? ? ?OB History  ?Gravida Para Term Preterm AB Living  ?1 0 0 0 1 0  ?SAB IAB Ectopic Multiple Live Births  ?0 0 1 0 0  ?  ?# Outcome Date GA Lbr Len/2nd Weight Sex Delivery Anes PTL Lv  ?1 Ectopic 04/15/21          ? ? ?Past Medical History:  ?Diagnosis Date  ? Asthma   ? H/O Fitz-Hugh-Curtis syndrome 04/10/2021  ? History of chlamydia 04/27/2021  ? Seizures ([facility])   ? ? ?Past Surgical History:  ?Procedure Laterality Date  ? DIAGNOSTIC LAPAROSCOPY WITH REMOVAL OF ECTOPIC PREGNANCY Left 04/10/2021  ? Procedure: DIAGNOSTIC LAPAROSCOPY WITH REMOVAL OF ECTOPIC PREGNANCY;  Surgeon: Hildred Laser, MD;  Location: [facility] ORS;  Service: Gynecology;  Laterality: Left;  ? ? ?Family History  ?Problem Relation Age of Onset  ? Hypertension Mother   ? Hypertension Sister   ? Diabetes Sister    ? ? ?Social History  ? ?Socioeconomic History  ? Marital status: Single  ?  Spouse name: Not on file  ? Number of children: Not on file  ? Years of education: Not on file  ? Highest education level: Not on file  ?Occupational History  ? Not on file  ?Tobacco Use  ? Smoking status: Never  ? Smokeless tobacco: Never  ?Vaping Use  ? Vaping Use: Never used  ?Substance and Sexual Activity  ? Alcohol use: Never  ? Drug use: Never  ? Sexual activity: Not on file  ?Other Topics Concern  ? Not on file  ?Social History Narrative  ? Not on file  ? ?Social Determinants of Health  ? ?Financial Resource Strain: Not on file  ?Food Insecurity: Not on file  ?Transportation Needs: Not on file  ?Physical Activity: Not on file  ?Stress: Not on file  ?Social Connections: Not on file  ?Intimate Partner Violence: Not on file  ? ? ?Current Outpatient Medications on File Prior to Visit  ?Medication Sig Dispense Refill  ? ibuprofen (ADVIL) 600 MG tablet Take 1 tablet (600 mg total) by mouth every 6 (six) hours as needed. 30 tablet 1  ? oxyCODONE-acetaminophen (PERCOCET) 5-325 MG tablet Take 1-2 tablets by mouth every 6 (six) hours as needed for severe pain. 15 tablet 0  ? ?No current facility-administered medications on file prior to visit.  ? ? ?Allergies  ?Allergen Reactions  ?  Other Itching and Shortness Of Breath  ? Penicillins Hives  ?  Other reaction(s): Other (See Comments) ?Other Reaction: Not Assessed ?  ? Latex Hives  ? Peanut (Diagnostic) Hives  ? ? ? ?Review of Systems ?Constitutional: negative for chills, fatigue, fevers and sweats ?Eyes: negative for irritation, redness and visual disturbance ?Ears, nose, mouth, throat, and face: negative for hearing loss, nasal congestion, snoring and tinnitus ?Respiratory: negative for asthma, cough, sputum ?Cardiovascular: negative for chest pain, dyspnea, exertional chest pressure/discomfort, irregular heart beat, palpitations and syncope ?Gastrointestinal: negative for abdominal pain,  change in bowel habits, nausea and vomiting ?Genitourinary: Positive for amenorrhea as 1 month.  Negative for genital lesions, sexual problems and vaginal discharge, dysuria and urinary incontinence ?Integument/breast: negative for breast lump, breast tenderness and nipple discharge ?Hematologic/lymphatic: negative for bleeding and easy bruising ?Musculoskeletal:negative for back pain and muscle weakness ?Neurological: negative for dizziness, headaches, vertigo and weakness ?Endocrine: negative for diabetic symptoms including polydipsia, polyuria and skin dryness ?Allergic/Immunologic: negative for hay fever and urticaria    ? ? ?Objective:  ?Blood pressure 113/70, pulse 83, resp. rate 16, height 5\' 4"  (1.626 m), weight 137 lb 8 oz (62.4 kg), last menstrual period 05/16/2021, not currently breastfeeding. Body mass index is 23.6 kg/m?. ? ?  ?General Appearance:    Alert, cooperative, no distress, appears stated age  ?Head:    Normocephalic, without obvious abnormality, atraumatic  ?Eyes:    PERRL, conjunctiva/corneas clear, EOM's intact, both eyes  ?Ears:    Normal external ear canals, both ears  ?Nose:   Nares normal, septum midline, mucosa normal, no drainage or sinus tenderness  ?Throat:   Lips, mucosa, and tongue normal; teeth and gums normal  ?Neck:   Supple, symmetrical, trachea midline, no adenopathy; thyroid: no enlargement/tenderness/nodules; no carotid bruit or JVD  ?Back:     Symmetric, no curvature, ROM normal, no CVA tenderness  ?Lungs:     Clear to auscultation bilaterally, respirations unlabored  ?Chest Wall:    No tenderness or deformity  ? Heart:    Regular rate and rhythm, S1 and S2 normal, no murmur, rub or gallop  ?Breast Exam:    No tenderness, masses, or nipple abnormality  ?Abdomen:     Soft, non-tender, bowel sounds active all four quadrants, no masses, no organomegaly.    ?Genitalia:    Pelvic:external genitalia normal, vagina without lesions, discharge, or tenderness, rectovaginal septum   normal. Cervix normal in appearance, no cervical motion tenderness, no adnexal masses or tenderness.  Uterus normal size, shape, mobile, regular contours, nontender.  ?Rectal:    Normal external sphincter.  No hemorrhoids appreciated. Internal exam not done.   ?Extremities:   Extremities normal, atraumatic, no cyanosis or edema  ?Pulses:   2+ and symmetric all extremities  ?Skin:   Skin color, texture, turgor normal, no rashes or lesions  ?Lymph nodes:   Cervical, supraclavicular, and axillary nodes normal  ?Neurologic:   CNII-XII intact, normal strength, sensation and reflexes throughout  ? ?. ? ?Labs:  ?Lab Results  ?Component Value Date  ? WBC 7.6 04/15/2021  ? HGB 8.3 (L) 04/15/2021  ? HCT 27.7 (L) 04/15/2021  ? MCV 75.7 (L) 04/15/2021  ? PLT 303 04/15/2021  ? ? ?Lab Results  ?Component Value Date  ? CREATININE 0.59 04/15/2021  ? BUN 8 04/15/2021  ? NA 134 (L) 04/15/2021  ? K 3.5 04/15/2021  ? CL 105 04/15/2021  ? CO2 24 04/15/2021  ? ? ?Lab Results  ?Component Value Date  ?  ALT 24 04/15/2021  ? AST 24 04/15/2021  ? ALKPHOS 33 (L) 04/15/2021  ? BILITOT 0.3 04/15/2021  ? ? ?No results found for: TSH ? ? ?Assessment:  ? ?1. Encounter for well woman exam with routine gynecological exam   ?2. Cervical cancer screening   ?3. History of ectopic pregnancy   ?4. Amenorrhea   ?5. Anemia due to acute blood loss   ? ?  ?Plan:  ?- Blood tests: CBC with diff and Quantitative hCG. ?- Breast self exam technique reviewed and patient encouraged to perform self-exam monthly. ?- Contraception: none. ?- Discussed healthy lifestyle modifications. ?- Mammogram  Not age appropriate ?- Pap smear ordered.  GC/Ct added to pap for routine recommended screening.  ?- Amenorrhea, may be secondary to hormones.  Will repeat hCG level to ensure complete resolution of her ectopic as well as ensure no new pregnancy although patient notes that she has not been sexually active recently.  If normal, patient can take a month supply of OCPs to  regulate cycles.  After next cycle, can schedule HSG. ?- Anemia, likely secondary to blood loss from ectopic pregnancy.  Will repeat levels today. ?- Follow up in 1 year for annual exam ? ? ?Hildred Laser, MD ?[facility]

## 2021-06-28 ENCOUNTER — Encounter: Payer: Self-pay | Admitting: Obstetrics and Gynecology

## 2021-06-28 ENCOUNTER — Ambulatory Visit (INDEPENDENT_AMBULATORY_CARE_PROVIDER_SITE_OTHER): Payer: Medicaid Other | Admitting: Obstetrics and Gynecology

## 2021-06-28 ENCOUNTER — Other Ambulatory Visit (HOSPITAL_COMMUNITY)
Admission: RE | Admit: 2021-06-28 | Discharge: 2021-06-28 | Disposition: A | Discharge status: Home / Self Care | Payer: Medicaid Other | Source: Ambulatory Visit | Attending: Obstetrics and Gynecology | Admitting: Obstetrics and Gynecology

## 2021-06-28 VITALS — BP 113/70 | HR 83 | Resp 16 | Ht 64.0 in | Wt 137.5 lb

## 2021-06-28 DIAGNOSIS — Z124 Encounter for screening for malignant neoplasm of cervix: Secondary | ICD-10-CM

## 2021-06-28 DIAGNOSIS — Z01419 Encounter for gynecological examination (general) (routine) without abnormal findings: Secondary | ICD-10-CM | POA: Diagnosis present

## 2021-06-28 DIAGNOSIS — D62 Acute posthemorrhagic anemia: Secondary | ICD-10-CM

## 2021-06-28 DIAGNOSIS — N912 Amenorrhea, unspecified: Secondary | ICD-10-CM

## 2021-06-28 DIAGNOSIS — D500 Iron deficiency anemia secondary to blood loss (chronic): Secondary | ICD-10-CM

## 2021-06-28 DIAGNOSIS — Z01411 Encounter for gynecological examination (general) (routine) with abnormal findings: Secondary | ICD-10-CM

## 2021-06-28 DIAGNOSIS — Z8759 Personal history of other complications of pregnancy, childbirth and the puerperium: Secondary | ICD-10-CM | POA: Diagnosis not present

## 2021-06-28 DIAGNOSIS — R87610 Atypical squamous cells of undetermined significance on cytologic smear of cervix (ASC-US): Secondary | ICD-10-CM | POA: Insufficient documentation

## 2021-06-29 LAB — CBC
Hematocrit: 29 % — ABNORMAL LOW (ref 34.0–46.6)
Hemoglobin: 8.8 g/dL — ABNORMAL LOW (ref 11.1–15.9)
MCH: 22.6 pg — ABNORMAL LOW (ref 26.6–33.0)
MCHC: 30.3 g/dL — ABNORMAL LOW (ref 31.5–35.7)
MCV: 74 fL — ABNORMAL LOW (ref 79–97)
Platelets: 295 10*3/uL (ref 150–450)
RBC: 3.9 x10E6/uL (ref 3.77–5.28)
RDW: 15.3 % (ref 11.7–15.4)
WBC: 3.3 10*3/uL — ABNORMAL LOW (ref 3.4–10.8)

## 2021-06-29 LAB — BETA HCG QUANT (REF LAB): hCG Quant: <1 m[IU]/mL

## 2021-06-29 MED ORDER — DOCUSATE SODIUM 100 MG PO CAPS: 100.0000 mg | ORAL_CAPSULE | Freq: Two times a day (BID) | ORAL | 2 refills | Status: DC | PRN

## 2021-06-29 MED ORDER — FERROUS SULFATE 325 (65 FE) MG PO TABS: 325.0000 mg | ORAL_TABLET | Freq: Every day | ORAL | 0 refills | Status: DC

## 2021-06-29 NOTE — Addendum Note (Signed)
Addended by: Fabian November on: 06/29/2021 04:25 PM ? ? Modules accepted: Orders ? ?

## 2021-07-04 ENCOUNTER — Encounter: Payer: Self-pay | Admitting: Obstetrics and Gynecology

## 2021-07-04 LAB — CYTOLOGY - PAP
Chlamydia: NEGATIVE
Comment: NEGATIVE
Comment: NEGATIVE
Comment: NORMAL
Diagnosis: UNDETERMINED — AB
High risk HPV: POSITIVE — AB
Neisseria Gonorrhea: NEGATIVE

## 2021-07-27 ENCOUNTER — Telehealth: Payer: Self-pay | Admitting: Obstetrics and Gynecology

## 2021-08-01 ENCOUNTER — Other Ambulatory Visit: Payer: Self-pay | Admitting: Obstetrics and Gynecology

## 2021-08-01 DIAGNOSIS — N971 Female infertility of tubal origin: Secondary | ICD-10-CM

## 2021-08-01 DIAGNOSIS — Z8619 Personal history of other infectious and parasitic diseases: Secondary | ICD-10-CM

## 2021-08-01 DIAGNOSIS — Z8759 Personal history of other complications of pregnancy, childbirth and the puerperium: Secondary | ICD-10-CM

## 2021-08-02 ENCOUNTER — Ambulatory Visit
Admission: RE | Admit: 2021-08-02 | Discharge: 2021-08-02 | Disposition: A | Discharge status: Home / Self Care | Payer: Medicaid Other | Source: Ambulatory Visit | Attending: Obstetrics and Gynecology | Admitting: Obstetrics and Gynecology

## 2021-08-02 DIAGNOSIS — N979 Female infertility, unspecified: Secondary | ICD-10-CM | POA: Diagnosis not present

## 2021-08-02 DIAGNOSIS — Z8759 Personal history of other complications of pregnancy, childbirth and the puerperium: Secondary | ICD-10-CM | POA: Diagnosis not present

## 2021-08-02 DIAGNOSIS — Z9079 Acquired absence of other genital organ(s): Secondary | ICD-10-CM | POA: Diagnosis not present

## 2021-08-02 DIAGNOSIS — N971 Female infertility of tubal origin: Secondary | ICD-10-CM | POA: Insufficient documentation

## 2021-08-02 DIAGNOSIS — Z8619 Personal history of other infectious and parasitic diseases: Secondary | ICD-10-CM | POA: Insufficient documentation

## 2021-08-02 MED ORDER — IOHEXOL 300 MG/ML  SOLN
30.0000 mL | Freq: Once | INTRAMUSCULAR | Status: AC | PRN
  Administered 2021-08-02: 20 mL

## 2021-08-04 ENCOUNTER — Encounter: Payer: Self-pay | Admitting: Obstetrics and Gynecology

## 2021-08-06 NOTE — Procedures (Signed)
Hysterosalpingogram Post-Procedure Note  Pre-procedure Diagnosis: Infertility, female; history of right salpingectomy for ectopic pregnancy treatment   Post-operative Diagnosis: Same   Indications: Infertility   Procedure Details:   Consent: Informed consent was obtained. Risks of the procedure were discussed including: infection, bleeding, pain, and allergic reaction to dye.  A speculum was inserted into the patient's vagina.  The cervix was cleansed with Betadine and a single-toothed tenaculum was used to grasp the anterior lip of the cervix.  The cervical opening was cannulated per standard procedure.  Then under fluoroscopic guidance, water soluble contrast was injected in a retrograde fashion. Spillage was noted from the right tube after approximately 4 seconds.  X-ray imaging captured per the Radiologist. The cannula was then removed from the uterine cavity.  The tenaculum was removed from the cervix with good hemostasis noted, and the speculum was removed.  The patient tolerated the procedure well.    Findings:   The uterus fills normally, with no evidence of contour abnormality, filling defect, septum, mass, or bicornuate configuration.   The left fallopian tube is normal and patent with normal rapid spillage of contrast into the peritoneum.   The left fallopian tube is surgically absent.  Fluoroscopy time: 45 seconds.   Complications: None     Condition: Stable  Plan: Patient given results of report verbally. To f/u in clinic when ready for further fertility management.    Hildred Laser, MD [facility]

## 2021-08-08 ENCOUNTER — Encounter: Payer: Self-pay | Admitting: Obstetrics and Gynecology

## 2021-08-08 ENCOUNTER — Ambulatory Visit (INDEPENDENT_AMBULATORY_CARE_PROVIDER_SITE_OTHER): Payer: Medicaid Other | Admitting: Obstetrics and Gynecology

## 2021-08-08 ENCOUNTER — Encounter: Payer: Medicaid Other | Admitting: Obstetrics and Gynecology

## 2021-08-08 VITALS — BP 114/73 | HR 80 | Resp 16 | Ht 64.0 in | Wt 139.7 lb

## 2021-08-08 DIAGNOSIS — Z8719 Personal history of other diseases of the digestive system: Secondary | ICD-10-CM | POA: Diagnosis not present

## 2021-08-08 DIAGNOSIS — Z8619 Personal history of other infectious and parasitic diseases: Secondary | ICD-10-CM | POA: Diagnosis not present

## 2021-08-08 DIAGNOSIS — Z8759 Personal history of other complications of pregnancy, childbirth and the puerperium: Secondary | ICD-10-CM

## 2021-08-08 DIAGNOSIS — Z9189 Other specified personal risk factors, not elsewhere classified: Secondary | ICD-10-CM | POA: Diagnosis not present

## 2021-08-08 NOTE — Progress Notes (Signed)
    GYNECOLOGY PROGRESS NOTE  Subjective:    Patient ID: Vickie Lewis, female    DOB: [birth year], 21 y.o.   MRN: [id number]  HPI  Patient is a 21 y.o. G38P0010 female who presents for follow-up after HSG procedure.  Patient had an HSG performed last week due to history of previous ectopic pregnancy (left), chlamydia infection and subsequent Fitz-Hugh Curtis syndrome.  The following portions of the patient's history were reviewed and updated as appropriate: allergies, current medications, past family history, past medical history, past social history, past surgical history, and problem list.  Review of Systems Pertinent items noted in HPI and remainder of comprehensive ROS otherwise negative.   Objective:   Blood pressure 114/73, pulse 80, resp. rate 16, height 5\' 4"  (1.626 m), weight 139 lb 11.2 oz (63.4 kg). Body mass index is 23.98 kg/m. General appearance: alert and no distress Remainder of exam deferred.    Imaging:  DG Hysterogram (HSG) CLINICAL DATA:  Provided history: Infertility of tubal origin. History of ectopic pregnancy. History of chlamydia. Infertility. Additional history provided: History of prior left salpingectomy.  EXAM: HYSTEROSALPINGOGRAM  TECHNIQUE: A fluoroscopic hysterosalpingogram was performed by Dr. . Please refer to Dr. Hildred Laser procedure report for further description. Oretha Milch, PA-C was present in the fluoroscopy room and operator the fluoroscopy equipment.  COMPARISON:  Pelvic ultrasound 04/15/2021.  FLUOROSCOPY: Fluoroscopy time: 1 minute  Radiation Exposure Index (as provided by the fluoroscopic device): 7.90 mGy Kerma  FINDINGS: The endometrial cavity is normal in contour. No appreciable focal uterine abnormality.  There is free intraperitoneal spill of contrast on the right consistent with right fallopian tube patency.  The left fallopian tube is not visualized, and findings are consistent with the patient's history  of prior left salpingectomy.  IMPRESSION: Normal endometrial contour.  Free intraperitoneal spill of contrast on the right consistent with right fallopian tube patency.  Nonvisualization of the left fallopian tube consistent with the patient's history of prior left salpingectomy.  Electronically Signed   By: 06/13/2021 D.O.   On: 08/02/2021 13:24     Assessment:   1. At risk for fertility problems   2. H/O Fitz-Hugh-Curtis syndrome   3. History of ectopic pregnancy      Plan:   Discussion had with patient regarding recent imaging.  Right-sided tubal patency noted.  Discussed that if pregnancy occurs she more than likely would not encounter any further issues this time.  Patient currently not desiring pregnancy would like to discuss contraceptive options.  Discussed contraceptive options in detail, patient unsure of desires at this time, handout given.

## 2021-08-11 NOTE — Telephone Encounter (Signed)
Made in error

## 2021-09-28 ENCOUNTER — Encounter: Payer: Self-pay | Admitting: Obstetrics and Gynecology

## 2022-06-18 NOTE — Progress Notes (Unsigned)
GYNECOLOGY ANNUAL PHYSICAL EXAM PROGRESS NOTE  Subjective:    Vickie Lewis is a 22 y.o. G84P0010 female who presents for an annual exam. The patient is not sexually active. The patient participates in regular exercise: no. Has the patient ever been transfused or tattooed?: yes (multiple professional, 1 unprofessional tattoo). The patient reports that there is not domestic violence in her life.   The patient has the following complaints today.  Reports urinary odor that seems to get stronger around the time of her cyle, smelled like "eggs".  Notes she has cut out sodas and increased water intake which has helped the odor some. Denies any other urinary symptoms.  Reports a false negative Hepatitis C test recently with STD screening (noted in Care Everywhere in February), wonders what could have caused this.  Notes that she has quesitons about her metabolism. Desires to gain a little weight but can never maintain weight above 135 lbs.    Menstrual History: Menarche age: 8 Patient's last menstrual period was 06/18/2022. Period Cycle (Days): 28 Period Duration (Days): 3-4 Period Pattern: (!) Irregular Menstrual Flow: Moderate Menstrual Control: Maxi pad Menstrual Control Change Freq (Hours): 2-3 Dysmenorrhea: (!) Mild Dysmenorrhea Symptoms: Cramping, Nausea     Gynecologic History:  Contraception: none History of STI's: Chlamydia  Last Pap: 06/28/2021. Results were: abnormal. Notes h/o abnormal pap smears. Last mammogram: Not age appropriate   Upstream - 06/19/22 0934       Pregnancy Intention Screening   Does the patient want to become pregnant in the next year? No    Does the patient's partner want to become pregnant in the next year? No    Would the patient like to discuss contraceptive options today? No      Contraception Wrap Up   Current Method No Contraceptive Precautions    End Method No Contraception Precautions    Contraception Counseling Provided No    How was the  end contraceptive method provided? N/A            The pregnancy intention screening data noted above was reviewed. Potential methods of contraception were discussed. The patient elected to proceed with No Contraception Precautions.   OB History  Gravida Para Term Preterm AB Living  1 0 0 0 1 0  SAB IAB Ectopic Multiple Live Births  0 0 1 0 0    # Outcome Date GA Lbr Len/2nd Weight Sex Delivery Anes PTL Lv  1 Ectopic 04/15/21            Past Medical History:  Diagnosis Date   Asthma    H/O Fitz-Hugh-Curtis syndrome 04/10/2021   History of chlamydia 04/27/2021   Seizures (HCC)     Past Surgical History:  Procedure Laterality Date   DIAGNOSTIC LAPAROSCOPY WITH REMOVAL OF ECTOPIC PREGNANCY Left 04/10/2021   Procedure: DIAGNOSTIC LAPAROSCOPY WITH REMOVAL OF ECTOPIC PREGNANCY;  Surgeon: Hildred Laser, MD;  Location: [facility] ORS;  Service: Gynecology;  Laterality: Left;    Family History  Problem Relation Age of Onset   Hypertension Mother    Hypertension Sister    Diabetes Sister     Social History   Socioeconomic History   Marital status: Single    Spouse name: Not on file   Number of children: Not on file   Years of education: Not on file   Highest education level: Not on file  Occupational History   Not on file  Tobacco Use   Smoking status: Never   Smokeless tobacco: Never  Vaping Use   Vaping Use: Never used  Substance and Sexual Activity   Alcohol use: Never   Drug use: Never   Sexual activity: Not on file  Other Topics Concern   Not on file  Social History Narrative   Not on file   Social Determinants of Health   Financial Resource Strain: Not on file  Food Insecurity: Not on file  Transportation Needs: Not on file  Physical Activity: Not on file  Stress: Not on file  Social Connections: Not on file  Intimate Partner Violence: Not on file    Current Outpatient Medications on File Prior to Visit  Medication Sig Dispense Refill   ibuprofen (ADVIL)  600 MG tablet Take 1 tablet (600 mg total) by mouth every 6 (six) hours as needed. 30 tablet 1   No current facility-administered medications on file prior to visit.    Allergies  Allergen Reactions   Other Itching and Shortness Of Breath   Penicillins Hives    Other reaction(s): Other (See Comments) Other Reaction: Not Assessed    Latex Hives   Peanut (Diagnostic) Hives     Review of Systems Constitutional: negative for chills, fatigue, fevers and sweats Eyes: negative for irritation, redness and visual disturbance Ears, nose, mouth, throat, and face: negative for hearing loss, nasal congestion, snoring and tinnitus Respiratory: negative for asthma, cough, sputum Cardiovascular: negative for chest pain, dyspnea, exertional chest pressure/discomfort, irregular heart beat, palpitations and syncope Gastrointestinal: negative for abdominal pain, change in bowel habits, nausea and vomiting Genitourinary: negative for abnormal menstrual periods, genital lesions, sexual problems and vaginal discharge, dysuria and urinary incontinence Integument/breast: negative for breast lump, breast tenderness and nipple discharge Hematologic/lymphatic: negative for bleeding and easy bruising Musculoskeletal:negative for back pain and muscle weakness Neurological: negative for dizziness, headaches, vertigo and weakness Endocrine: negative for diabetic symptoms including polydipsia, polyuria and skin dryness Allergic/Immunologic: negative for hay fever and urticaria      Objective:  Resp. rate 16, height  (1.626 m), weight 134 lb (60.8 kg), last menstrual period 06/18/2022. Body mass index is 23 kg/m.    General Appearance:    Alert, cooperative, no distress, appears stated age  Head:    Normocephalic, without obvious abnormality, atraumatic  Eyes:    PERRL, conjunctiva/corneas clear, EOM's intact, both eyes  Ears:    Normal external ear canals, both ears  Nose:   Nares normal, septum  midline, mucosa normal, no drainage or sinus tenderness  Throat:   Lips, mucosa, and tongue normal; teeth and gums normal  Neck:   Supple, symmetrical, trachea midline, no adenopathy; thyroid: no enlargement/tenderness/nodules; no carotid bruit or JVD  Back:     Symmetric, no curvature, ROM normal, no CVA tenderness  Lungs:     Clear to auscultation bilaterally, respirations unlabored  Chest Wall:    No tenderness or deformity   Heart:    Regular rate and rhythm, S1 and S2 normal, no murmur, rub or gallop  Breast Exam:    No tenderness, masses, or nipple abnormality  Abdomen:     Soft, non-tender, bowel sounds active all four quadrants, no masses, no organomegaly.    Genitalia:    Pelvic:external genitalia normal, vagina without lesions, or tenderness. Moderate amount of dark red blood in vaginal vault. rectovaginal septum  normal. Cervix normal in appearance, no cervical motion tenderness, no adnexal masses or tenderness.  Uterus normal size, shape, mobile, regular contours, nontender.  Rectal:    Normal external sphincter.  No hemorrhoids  appreciated. Internal exam not done.   Extremities:   Extremities normal, atraumatic, no cyanosis or edema  Pulses:   2+ and symmetric all extremities  Skin:   Skin color, texture, turgor normal, no rashes or lesions  Lymph nodes:   Cervical, supraclavicular, and axillary nodes normal  Neurologic:   CNII-XII intact, normal strength, sensation and reflexes throughout     Labs:  Performed by PCP, labs reviewed in Care Everywhere   Results for orders placed or performed in visit on 06/19/22  POCT Urinalysis Dipstick  Result Value Ref Range   Color, UA     Clarity, UA     Glucose, UA Negative Negative   Bilirubin, UA Negative    Ketones, UA Negative    Spec Grav, UA 1.025 1.010 - 1.025   Blood, UA Large    pH, UA 5.0 5.0 - 8.0   Protein, UA Positive (A) Negative   Urobilinogen, UA 0.2 0.2 or 1.0 E.U./dL   Nitrite, UA Negative    Leukocytes, UA  Trace (A) Negative   Appearance     Odor      Assessment:   1. Encounter for well woman exam with routine gynecological exam   2. History of abnormal cervical Pap smear   3. Abnormal urine odor   4. False positive serological test for hepatitis C      Plan:  - Blood tests: None ordered. Performed by PCP in February.  - Breast self exam technique reviewed and patient encouraged to perform self-exam monthly. - Contraception: none. - Discussed healthy lifestyle modifications, self breast exams. - Mammogram  : Not age appropriate - Pap smear ordered for history of abnormal ASCUS pap smear last year.  - COVID vaccination status: Is eligible for booster dosing - Notes a false positive Hep C test (Ab +, RNA neg). 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  - Abnormal urine odor, UA today negative. Discussed dietary modifications, urinary probiotic.  - Follow up in 1 year for annual exam   Hildred Laser, MD [facility]

## 2022-06-18 NOTE — Patient Instructions (Signed)
Preventive Care 21-24 Years Old, Female Preventive care refers to lifestyle choices and visits with your health care provider that can promote health and wellness. Preventive care visits are also called wellness exams. What can I expect for my preventive care visit? Counseling During your preventive care visit, your health care provider may ask about your: Medical history, including: Past medical problems. Family medical history. Pregnancy history. Current health, including: Menstrual cycle. Method of birth control. Emotional well-being. Home life and relationship well-being. Sexual activity and sexual health. Lifestyle, including: Alcohol, nicotine or tobacco, and drug use. Access to firearms. Diet, exercise, and sleep habits. Work and work environment. Sunscreen use. Safety issues such as seatbelt and bike helmet use. Physical exam Your health care provider may check your: Height and weight. These may be used to calculate your BMI (body mass index). BMI is a measurement that tells if you are at a healthy weight. Waist circumference. This measures the distance around your waistline. This measurement also tells if you are at a healthy weight and may help predict your risk of certain diseases, such as type 2 diabetes and high blood pressure. Heart rate and blood pressure. Body temperature. Skin for abnormal spots. What immunizations do I need?  Vaccines are usually given at various ages, according to a schedule. Your health care provider will recommend vaccines for you based on your age, medical history, and lifestyle or other factors, such as travel or where you work. What tests do I need? Screening Your health care provider may recommend screening tests for certain conditions. This may include: Pelvic exam and Pap test. Lipid and cholesterol levels. Diabetes screening. This is done by checking your blood sugar (glucose) after you have not eaten for a while (fasting). Hepatitis  B test. Hepatitis C test. HIV (human immunodeficiency virus) test. STI (sexually transmitted infection) testing, if you are at risk. BRCA-related cancer screening. This may be done if you have a family history of breast, ovarian, tubal, or peritoneal cancers. Talk with your health care provider about your test results, treatment options, and if necessary, the need for more tests. Follow these instructions at home: Eating and drinking  Eat a healthy diet that includes fresh fruits and vegetables, whole grains, lean protein, and low-fat dairy products. Take vitamin and mineral supplements as recommended by your health care provider. Do not drink alcohol if: Your health care provider tells you not to drink. You are pregnant, may be pregnant, or are planning to become pregnant. If you drink alcohol: Limit how much you have to 0-1 drink a day. Know how much alcohol is in your drink. In the [place] one drink equals one 12 oz bottle of beer (355 mL), one 5 oz glass of wine (148 mL), or one 1 oz glass of hard liquor (44 mL). Lifestyle Brush your teeth every morning and night with fluoride toothpaste. Floss one time each day. Exercise for at least 30 minutes 5 or more days each week. Do not use any products that contain nicotine or tobacco. These products include cigarettes, chewing tobacco, and vaping devices, such as e-cigarettes. If you need help quitting, ask your health care provider. Do not use drugs. If you are sexually active, practice safe sex. Use a condom or other form of protection to prevent STIs. If you do not wish to become pregnant, use a form of birth control. If you plan to become pregnant, see your health care provider for a prepregnancy visit. Find healthy ways to manage stress, such as: Meditation,   yoga, or listening to music. Journaling. Talking to a trusted person. Spending time with friends and family. Minimize exposure to UV radiation to reduce your risk of skin  cancer. Safety Always wear your seat belt while driving or riding in a vehicle. Do not drive: If you have been drinking alcohol. Do not ride with someone who has been drinking. If you have been using any mind-altering substances or drugs. While texting. When you are tired or distracted. Wear a helmet and other protective equipment during sports activities. If you have firearms in your house, make sure you follow all gun safety procedures. Seek help if you have been physically or sexually abused. What's next? Go to your health care provider once a year for an annual wellness visit. Ask your health care provider how often you should have your eyes and teeth checked. Stay up to date on all vaccines. This information is not intended to replace advice given to you by your health care provider. Make sure you discuss any questions you have with your health care provider. Document Revised: 08/17/2020 Document Reviewed: 08/17/2020 Elsevier Patient Education  2023 Elsevier Inc. Breast Self-Awareness Breast self-awareness is knowing how your breasts look and feel. You need to: Check your breasts on a regular basis. Tell your doctor about any changes. Become familiar with the look and feel of your breasts. This can help you catch a breast problem while it is still small and can be treated. You should do breast self-exams even if you have breast implants. What you need: A mirror. A well-lit room. A pillow or other soft object. How to do a breast self-exam Follow these steps to do a breast self-exam: Look for changes  Take off all the clothes above your waist. Stand in front of a mirror in a room with good lighting. Put your hands down at your sides. Compare your breasts in the mirror. Look for any difference between them, such as: A difference in shape. A difference in size. Wrinkles, dips, and bumps in one breast and not the other. Look at each breast for changes in the skin, such  as: Redness. Scaly areas. Skin that has gotten thicker. Dimpling. Open sores (ulcers). Look for changes in your nipples, such as: Fluid coming out of a nipple. Fluid around a nipple. Bleeding. Dimpling. Redness. A nipple that looks pushed in (retracted), or that has changed position. Feel for changes Lie on your back. Feel each breast. To do this: Pick a breast to feel. Place a pillow under the shoulder closest to that breast. Put the arm closest to that breast behind your head. Feel the nipple area of that breast using the hand of your other arm. Feel the area with the pads of your three middle fingers by making small circles with your fingers. Use light, medium, and firm pressure. Continue the overlapping circles, moving downward over the breast. Keep making circles with your fingers. Stop when you feel your ribs. Start making circles with your fingers again, this time going upward until you reach your collarbone. Then, make circles outward across your breast and into your armpit area. Squeeze your nipple. Check for discharge and lumps. Repeat these steps to check your other breast. Sit or stand in the tub or shower. With soapy water on your skin, feel each breast the same way you did when you were lying down. Write down what you find Writing down what you find can help you remember what to tell your doctor. Write down: What is   normal for each breast. Any changes you find in each breast. These include: The kind of changes you find. A tender or painful breast. Any lump you find. Write down its size and where it is. When you last had your monthly period (menstrual cycle). General tips If you are breastfeeding, the best time to check your breasts is after you feed your baby or after you use a breast pump. If you get monthly bleeding, the best time to check your breasts is 5-7 days after your monthly cycle ends. With time, you will become comfortable with the self-exam. You will  also start to know if there are changes in your breasts. Contact a doctor if: You see a change in the shape or size of your breasts or nipples. You see a change in the skin of your breast or nipples, such as red or scaly skin. You have fluid coming from your nipples that is not normal. You find a new lump or thick area. You have breast pain. You have any concerns about your breast health. Summary Breast self-awareness includes looking for changes in your breasts and feeling for changes within your breasts. You should do breast self-awareness in front of a mirror in a well-lit room. If you get monthly periods (menstrual cycles), the best time to check your breasts is 5-7 days after your period ends. Tell your doctor about any changes you see in your breasts. Changes include changes in size, changes on the skin, painful or tender breasts, or fluid from your nipples that is not normal. This information is not intended to replace advice given to you by your health care provider. Make sure you discuss any questions you have with your health care provider. Document Revised: 07/27/2021 Document Reviewed: 12/22/2020 Elsevier Patient Education  2023 Elsevier Inc.  

## 2022-06-19 ENCOUNTER — Encounter: Payer: Self-pay | Admitting: Obstetrics and Gynecology

## 2022-06-19 ENCOUNTER — Ambulatory Visit (INDEPENDENT_AMBULATORY_CARE_PROVIDER_SITE_OTHER): Payer: Managed Care, Other (non HMO) | Admitting: Obstetrics and Gynecology

## 2022-06-19 ENCOUNTER — Other Ambulatory Visit (HOSPITAL_COMMUNITY)
Admission: RE | Admit: 2022-06-19 | Discharge: 2022-06-19 | Disposition: A | Discharge status: Home / Self Care | Payer: Managed Care, Other (non HMO) | Source: Ambulatory Visit | Attending: Obstetrics and Gynecology | Admitting: Obstetrics and Gynecology

## 2022-06-19 VITALS — BP 115/66 | HR 107 | Resp 16 | Ht 64.0 in | Wt 134.0 lb

## 2022-06-19 DIAGNOSIS — Z114 Encounter for screening for human immunodeficiency virus [HIV]: Secondary | ICD-10-CM

## 2022-06-19 DIAGNOSIS — Z01419 Encounter for gynecological examination (general) (routine) without abnormal findings: Secondary | ICD-10-CM | POA: Insufficient documentation

## 2022-06-19 DIAGNOSIS — R8290 Unspecified abnormal findings in urine: Secondary | ICD-10-CM | POA: Diagnosis not present

## 2022-06-19 DIAGNOSIS — Z124 Encounter for screening for malignant neoplasm of cervix: Secondary | ICD-10-CM | POA: Diagnosis present

## 2022-06-19 DIAGNOSIS — Z01411 Encounter for gynecological examination (general) (routine) with abnormal findings: Secondary | ICD-10-CM | POA: Diagnosis not present

## 2022-06-19 DIAGNOSIS — Z1159 Encounter for screening for other viral diseases: Secondary | ICD-10-CM

## 2022-06-19 DIAGNOSIS — Z8742 Personal history of other diseases of the female genital tract: Secondary | ICD-10-CM

## 2022-06-19 DIAGNOSIS — R768 Other specified abnormal immunological findings in serum: Secondary | ICD-10-CM

## 2022-06-19 LAB — POCT URINALYSIS DIPSTICK
Bilirubin, UA: NEGATIVE
Glucose, UA: NEGATIVE
Ketones, UA: NEGATIVE
Nitrite, UA: NEGATIVE
Protein, UA: POSITIVE — AB
Spec Grav, UA: 1.025 (ref 1.010–1.025)
Urobilinogen, UA: 0.2 E.U./dL
pH, UA: 5 (ref 5.0–8.0)

## 2022-06-21 LAB — CYTOLOGY - PAP: Diagnosis: NEGATIVE

## 2022-08-22 ENCOUNTER — Encounter: Payer: Self-pay | Admitting: Obstetrics and Gynecology

## 2022-09-10 ENCOUNTER — Ambulatory Visit: Payer: Self-pay

## 2022-09-13 ENCOUNTER — Ambulatory Visit (INDEPENDENT_AMBULATORY_CARE_PROVIDER_SITE_OTHER): Payer: PRIVATE HEALTH INSURANCE

## 2022-09-13 ENCOUNTER — Other Ambulatory Visit (HOSPITAL_COMMUNITY): Admission: RE | Admit: 2022-09-13 | Payer: PRIVATE HEALTH INSURANCE | Source: Ambulatory Visit

## 2022-09-13 VITALS — BP 126/84 | HR 96 | Ht 64.0 in | Wt 138.1 lb

## 2022-09-13 DIAGNOSIS — Z113 Encounter for screening for infections with a predominantly sexual mode of transmission: Secondary | ICD-10-CM | POA: Insufficient documentation

## 2022-09-13 DIAGNOSIS — L750 Bromhidrosis: Secondary | ICD-10-CM | POA: Diagnosis not present

## 2022-09-13 LAB — POCT URINALYSIS DIPSTICK
Bilirubin, UA: NEGATIVE
Glucose, UA: NEGATIVE
Ketones, UA: NEGATIVE
Leukocytes, UA: NEGATIVE
Nitrite, UA: NEGATIVE
Protein, UA: NEGATIVE
Spec Grav, UA: 1.02 (ref 1.010–1.025)
Urobilinogen, UA: 0.2 E.U./dL
pH, UA: 5 (ref 5.0–8.0)

## 2022-09-13 NOTE — Progress Notes (Signed)
    NURSE VISIT NOTE  Subjective:    Patient ID: Vickie Lewis, female    DOB: 2000/12/28, 22 y.o.   MRN: [id number]       HPI  Patient is a 22 y.o. G63P0010 female who presents for  urinary odor  for 2 month.  Patient denies dysuria, hematuria, urinary frequency, urinary urgency, flank pain, abdominal pain, pelvic pain, cloudy malordorous urine, genital rash, genital irritation, and vaginal discharge.  Patient does not have a history of recurrent UTI.  Patient does not have a history of pyelonephritis.    Objective:    BP 126/84   Pulse 96   Ht 5\' 4"  (1.626 m)   Wt 138 lb 1.6 oz (62.6 kg)   LMP 08/11/2022 (Exact Date)   BMI 23.70 kg/m    Lab Review  No results found for any visits on 09/13/22.  Assessment:   1. Urinary body odor   2. Screening for STD (sexually transmitted disease)      Plan:   Urine Culture Sent. Maintain adequate hydration.  Follow up if symptoms worsen or fail to improve as anticipated, and as needed.    Fonda Kinder, CMA

## 2022-09-18 LAB — CERVICOVAGINAL ANCILLARY ONLY
Bacterial Vaginitis (gardnerella): POSITIVE — AB
Candida Glabrata: NEGATIVE
Candida Vaginitis: NEGATIVE
Chlamydia: NEGATIVE
Comment: NEGATIVE
Comment: NEGATIVE
Comment: NEGATIVE
Comment: NEGATIVE
Comment: NEGATIVE
Comment: NORMAL
Neisseria Gonorrhea: NEGATIVE
Trichomonas: NEGATIVE

## 2022-09-18 LAB — URINE CULTURE

## 2022-09-20 ENCOUNTER — Other Ambulatory Visit: Payer: Self-pay | Admitting: Licensed Practical Nurse

## 2022-09-20 DIAGNOSIS — B9689 Other specified bacterial agents as the cause of diseases classified elsewhere: Secondary | ICD-10-CM

## 2022-09-20 MED ORDER — METRONIDAZOLE 500 MG PO TABS: 500.0000 mg | ORAL_TABLET | Freq: Two times a day (BID) | ORAL | 0 refills | Status: DC

## 2022-09-20 NOTE — Progress Notes (Signed)
TC to Vickie Lewis You have BV, pt familiar with BV. She does not drink alcohol.  Your urine shows a little bacteria but not at a level that we would consider a UTI Script for Flagyl sent to pharmacy on file.  Carie Caddy, CNM  [facility]  09/20/22  11:21 AM

## 2023-03-27 NOTE — Progress Notes (Deleted)
    GYNECOLOGY PROGRESS NOTE  Subjective:    Patient ID: Vickie Lewis, female    DOB: 2000/12/22, 23 y.o.   MRN: [id number]  HPI  Patient is a 23 y.o. G63P0010 female who presents for evaluation for odor in her urine. She reports that her urine smells like boiled eggs.   {Common ambulatory SmartLinks:19316}  Review of Systems {ros; complete:30496}   Objective:   There were no vitals taken for this visit. There is no height or weight on file to calculate BMI. General appearance: {general exam:16600} Abdomen: {abdominal exam:16834} Pelvic: {pelvic exam:16852::"cervix normal in appearance","external genitalia normal","no adnexal masses or tenderness","no cervical motion tenderness","rectovaginal septum normal","uterus normal size, shape, and consistency","vagina normal without discharge"} Extremities: {extremity exam:5109} Neurologic: {neuro exam:17854}   Assessment:   1. Urinary body odor      Plan:   There are no diagnoses linked to this encounter.     Hildred Laser, MD [facility]

## 2023-03-28 ENCOUNTER — Ambulatory Visit (INDEPENDENT_AMBULATORY_CARE_PROVIDER_SITE_OTHER): Payer: Managed Care, Other (non HMO) | Admitting: Obstetrics

## 2023-03-28 ENCOUNTER — Ambulatory Visit: Payer: Self-pay | Admitting: Obstetrics and Gynecology

## 2023-03-28 ENCOUNTER — Other Ambulatory Visit (HOSPITAL_COMMUNITY)
Admission: RE | Admit: 2023-03-28 | Discharge: 2023-03-28 | Disposition: A | Discharge status: Home / Self Care | Payer: Managed Care, Other (non HMO) | Source: Ambulatory Visit | Attending: Obstetrics | Admitting: Obstetrics

## 2023-03-28 ENCOUNTER — Encounter: Payer: Self-pay | Admitting: Obstetrics

## 2023-03-28 VITALS — BP 120/80 | Ht 64.0 in | Wt 137.0 lb

## 2023-03-28 DIAGNOSIS — Z113 Encounter for screening for infections with a predominantly sexual mode of transmission: Secondary | ICD-10-CM

## 2023-03-28 DIAGNOSIS — L750 Bromhidrosis: Secondary | ICD-10-CM

## 2023-03-28 DIAGNOSIS — R8290 Unspecified abnormal findings in urine: Secondary | ICD-10-CM

## 2023-03-28 LAB — POCT URINALYSIS DIPSTICK
Bilirubin, UA: NEGATIVE
Blood, UA: NEGATIVE
Glucose, UA: NEGATIVE
Ketones, UA: NEGATIVE
Leukocytes, UA: NEGATIVE
Nitrite, UA: NEGATIVE
Protein, UA: POSITIVE — AB
Spec Grav, UA: 1.01 (ref 1.010–1.025)
Urobilinogen, UA: 0.2 U/dL
pH, UA: 6.5 (ref 5.0–8.0)

## 2023-03-28 NOTE — Progress Notes (Signed)
    GYNECOLOGY PROGRESS NOTE  Subjective:    Patient ID: Vickie Lewis, female    DOB: [birth year], 23 y.o.   MRN: [id number]  HPI  Patient is a 23 y.o. G44P0010 female who presents for evaluation for odor in her urine. She reports that her urine smells like boiled eggs x 1 year. Worse around time of menses. Is constant, has never really resolved and feels over the past year, it has gradually worsened. She is sure it is not a vaginal odor, is only present once she urinates. Is afraid to use public restrooms because of the odor. Denies hx of kidney stones and no flank pain. Does eat a lot of vegetables. Feels the odor improves when she drinks a lot of water.   The following portions of the patient's history were reviewed and updated as appropriate: allergies, current medications, past family history, past medical history, past social history, past surgical history, and problem list.  Review of Systems Pertinent items are noted in HPI.   Objective:   Blood pressure 120/80, height 5\' 4"  (1.626 m), weight 137 lb (62.1 kg), last menstrual period 03/14/2023. Body mass index is 23.52 kg/m. General appearance: alert and cooperative Abdomen: soft, non-tender; bowel sounds normal; no masses,  no organomegaly Pelvic: deferred Extremities: extremities normal, atraumatic, no cyanosis or edema Neurologic: Grossly normal   Assessment:   1. Abnormal urine odor   2. Screening examination for STI       Plan:   Abnormal urine odor: -UA and UCx ordered today; will check A1c -Pt is asymptomatic, reassurance given that if testing comes back normal, the abnormal odor most likely does not have any risks to her health. A variety of outside influences can affect urine odor, including but not limited to diet, stress, hormones, medications or supplements.  -Encouraged her to disregard the odor if everything comes back reassuring.   STI screen:  -Self-swab and labs done today, will notify of results   Julieanne Manson, DO [facility]

## 2023-03-29 LAB — HEMOGLOBIN A1C
Est. average glucose Bld gHb Est-mCnc: 108 mg/dL
Hgb A1c MFr Bld: 5.4 % (ref 4.8–5.6)

## 2023-03-29 LAB — HEP, RPR, HIV PANEL
HIV Screen 4th Generation wRfx: NONREACTIVE
Hepatitis B Surface Ag: NEGATIVE
RPR Ser Ql: NONREACTIVE

## 2023-03-31 LAB — URINE CULTURE

## 2023-04-01 ENCOUNTER — Other Ambulatory Visit: Payer: Self-pay | Admitting: Obstetrics

## 2023-04-01 ENCOUNTER — Encounter: Payer: Self-pay | Admitting: Obstetrics

## 2023-04-01 DIAGNOSIS — N3000 Acute cystitis without hematuria: Secondary | ICD-10-CM

## 2023-04-01 MED ORDER — NITROFURANTOIN MONOHYD MACRO 100 MG PO CAPS: 100.0000 mg | ORAL_CAPSULE | Freq: Two times a day (BID) | ORAL | 1 refills | Status: DC

## 2023-04-01 MED ORDER — FLUCONAZOLE 150 MG PO TABS: 150.0000 mg | ORAL_TABLET | Freq: Once | ORAL | 0 refills | Status: AC

## 2023-04-02 LAB — CERVICOVAGINAL ANCILLARY ONLY
Chlamydia: NEGATIVE
Comment: NEGATIVE
Comment: NEGATIVE
Comment: NORMAL
Neisseria Gonorrhea: NEGATIVE
Trichomonas: NEGATIVE

## 2023-05-27 DIAGNOSIS — B009 Herpesviral infection, unspecified: Secondary | ICD-10-CM | POA: Insufficient documentation

## 2023-06-12 ENCOUNTER — Encounter: Payer: Self-pay | Admitting: Obstetrics

## 2023-06-12 ENCOUNTER — Ambulatory Visit (INDEPENDENT_AMBULATORY_CARE_PROVIDER_SITE_OTHER): Admitting: Obstetrics

## 2023-06-12 VITALS — BP 109/69 | HR 77 | Ht 64.0 in | Wt 137.0 lb

## 2023-06-12 DIAGNOSIS — N923 Ovulation bleeding: Secondary | ICD-10-CM

## 2023-06-12 DIAGNOSIS — Z8759 Personal history of other complications of pregnancy, childbirth and the puerperium: Secondary | ICD-10-CM | POA: Diagnosis not present

## 2023-06-12 DIAGNOSIS — B009 Herpesviral infection, unspecified: Secondary | ICD-10-CM | POA: Diagnosis not present

## 2023-06-12 NOTE — Progress Notes (Signed)
    GYNECOLOGY PROGRESS NOTE  Subjective:  PCP: System, Provider Not In  Patient ID: Vickie Lewis, female    DOB: 24-Oct-2000, 23 y.o.   MRN: [id number]  HPI  Patient is a 23 y.o. G93P0010 female who presents for irregular periods, she had 2 periods last month in March. They were 2 weeks apart. First one started 3/2-3/5, this was when her usual period was due and she feels this was her true period. Then had some lighter bleeding two weeks later, 3/17-3/20. This is the first time she has had 2 periods in one month.   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 Would like to discuss what this means and prognosis.   Lastly, would like reassurance she is still able to have children in the future after having one of her fallopian tubes removed due to hx of ectopic.   The following portions of the patient's history were reviewed and updated as appropriate: allergies, current medications, past family history, past medical history, past social history, past surgical history, and problem list.  Review of Systems Pertinent items are noted in HPI.   Objective:   Blood pressure 109/69, pulse 77, height 5\' 4"  (1.626 m), weight 137 lb (62.1 kg), last menstrual period 05/20/2023. Body mass index is 23.52 kg/m.  General appearance: alert, cooperative, and appears stated age Pelvic: deferred Extremities: extremities normal, atraumatic, no cyanosis or edema Neurologic: Grossly normal   Assessment/Plan:   1. Intermenstrual bleeding   2. HSV-1 infection   3. History of ectopic pregnancy     Vickie Lewis is a 23 y.o. G1P0010 here today with one episode of intermenstrual bleeding last month, suspect due to timing, correlates with ovulation. Discussed how this can happen, despite never happening prior, and recommend watchful waiting, which patient is amenable to.   Reviewed HSV-1 questions and assured patient she does not have an STI. She did not take the Rx and we  talked about episodic treatment for future outbreaks, taking as soon as prodrome begins, and when she may consider prophylaxis if recurrent.   Lastly, reassurance given that having one missing tube may make conception a little more difficult, but she is, to the best of our knowledge, still fertile at this time, having regular periods, and do not foresee any further barriers to childbearing when she is ready.     Julieanne Manson, DO [facility]

## 2023-10-04 ENCOUNTER — Other Ambulatory Visit: Payer: Self-pay | Admitting: Licensed Practical Nurse

## 2023-10-04 DIAGNOSIS — B9689 Other specified bacterial agents as the cause of diseases classified elsewhere: Secondary | ICD-10-CM

## 2024-03-06 NOTE — Progress Notes (Signed)
 "   GYNECOLOGY ANNUAL PHYSICAL EXAM NOTE  Subjective:    Vickie Lewis is a 24 y.o. G31P0010 female who presents for an annual exam.  The patient is not currently sexually active. The patient participates in regular exercise: yes. Has the patient ever been transfused or tattooed?: yes. The patient reports that there is not domestic violence in her life. The patient has completed the Gardasil vaccine: no  The patient has the following complaints today: Patient would like a full STD screen today.  Denies symptoms.  Menstrual History: Menarche age: 73 Patient's last menstrual period was 02/23/2024 (exact date). Period Cycle (Days): 28 Period Duration (Days): 4 Period Pattern: Regular Menstrual Flow: Heavy, Light Menstrual Control: Maxi pad Menstrual Control Change Freq (Hours): 3 Dysmenorrhea: (!) Mild Dysmenorrhea Symptoms: Cramping, Diarrhea   Gynecologic History:  Contraception: none History of STI's:  Last Pap: 06/19/2022. Results were: normal.   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   History of chlamydia 04/27/2021   HSV-1 infection    Seizures (HCC)     Past Surgical History:  Procedure Laterality Date   DIAGNOSTIC LAPAROSCOPY WITH REMOVAL OF ECTOPIC PREGNANCY Left 04/10/2021   Procedure: DIAGNOSTIC LAPAROSCOPY WITH REMOVAL OF ECTOPIC PREGNANCY;  Surgeon: Connell Davies, MD;  Location: [facility] ORS;  Service: Gynecology;  Laterality: Left;    Family History  Problem Relation Age of Onset   Hypertension Mother    Hypertension Sister    Diabetes Sister     Social History   Socioeconomic History   Marital status: Single    Spouse name:  Not on file   Number of children: Not on file   Years of education: Not on file   Highest education level: Not on file  Occupational History   Not on file  Tobacco Use   Smoking status: Never   Smokeless tobacco: Never  Vaping Use   Vaping status: Never Used  Substance and Sexual Activity   Alcohol use: Never   Drug use: Never   Sexual activity: Not Currently    Birth control/protection: None  Other Topics Concern   Not on file  Social History Narrative   Not on file   Social Drivers of Health   Tobacco Use: Low Risk (03/12/2024)   Patient History    Smoking Tobacco Use: Never    Smokeless Tobacco Use: Never    Passive Exposure: Not on file  Financial Resource Strain: Low Risk  (06/27/2023)   Received from [facility]   Overall Financial Resource Strain (CARDIA)    Difficulty of Paying Living Expenses: Not very hard  Food Insecurity: No Food Insecurity (06/27/2023)   Received from [facility]   Epic    Within the past 12 months, you worried that your food would run out before you got the money to buy more.: Never true    Within the past 12 months, the food you bought just didn't last and you didn't have money to get more.: Never true  Transportation Needs: No Transportation  Needs (06/27/2023)   Received from [facility] - Transportation    In the past 12 months, has lack of transportation kept you from medical appointments or from getting medications?: No    Lack of Transportation (Non-Medical): No  Physical Activity: Not on file  Stress: Not on file  Social Connections: Not on file  Intimate Partner Violence: Not on file  Depression (PHQ2-9): Low Risk (03/12/2024)   Depression (PHQ2-9)    PHQ-2 Score: 0  Alcohol Screen: Not on file  Housing: Unknown (06/27/2023)   [facility]   Epic    In the last 12 months, was there a time when you were not able to pay the mortgage or rent  on time?: No    Number of Times Moved in the Last Year: Not on file    At any time in the past 12 months, were you homeless or living in a shelter (including now)?: No  Utilities: Not At Risk (06/27/2023)   Received from [facility] Utilities    Threatened with loss of utilities: No  Health Literacy: Not on file    Medications Ordered Prior to Encounter[1]  Allergies[2]  Review of Systems Constitutional: negative for chills, fatigue, fevers and sweats Eyes: negative for irritation, redness and visual disturbance Ears, nose, mouth, throat, and face: negative for hearing loss, nasal congestion, snoring and tinnitus Respiratory: negative for asthma, cough, sputum Cardiovascular: negative for chest pain, dyspnea, exertional chest pressure/discomfort, irregular heart beat, palpitations and syncope Gastrointestinal: negative for abdominal pain, change in bowel habits, nausea and vomiting Genitourinary: negative for abnormal menstrual periods, genital lesions, sexual problems and vaginal discharge, dysuria and urinary incontinence Integument/breast: negative for breast lump, breast tenderness and nipple discharge Hematologic/lymphatic: negative for bleeding and easy bruising Musculoskeletal:negative for back pain and muscle weakness Neurological: negative for dizziness, headaches, vertigo and weakness Endocrine: negative for diabetic symptoms including polydipsia, polyuria and skin dryness Allergic/Immunologic: negative for hay fever and urticaria      Objective:  Blood pressure 129/84, pulse 95, weight 143 lb 3.2 oz (65 kg), last menstrual period 02/23/2024. Body mass index is 24.58 kg/m.    General Appearance:    Alert, cooperative, no distress, appears stated age  Head:    Normocephalic, without obvious abnormality, atraumatic  Eyes:    Conjunctiva/corneas clear, EOMs intact bilaterally  Ears:    Normal external ear canals bilaterally  Nose:   Nares normal   Throat:   Lips, mucosa, and tongue normal; teeth and gums normal  Neck:   Supple, symmetrical, trachea midline, no adenopathy; thyroid: no enlargement/tenderness/nodules; no carotid bruit or JVD  Back:     ROM normal, no CVA tenderness  Lungs:     Clear to auscultation bilaterally, respirations unlabored  Chest Wall:    No tenderness or deformity   Heart:    Regular rate and rhythm, S1 and S2 normal, no appreciated murmur, rub or gallop  Breast Exam:    No tenderness, masses, or nipple abnormality; no skin retraction, dimpling or nipple discharge.  Abdomen:     Soft, non-tender, bowel sounds active all four quadrants, no masses, no organomegaly.    Genitalia:    Pelvic: external genitalia normal, vagina without lesions, discharge, or tenderness, rectovaginal septum  normal. Cervix normal in appearance, no cervical motion tenderness, no adnexal masses or tenderness.  Uterus normal size, shape, mobile, regular contours, nontender.  Rectal:    Normal external sphincter.  No hemorrhoids appreciated. Internal exam not done.   Extremities:   Extremities normal, atraumatic, no cyanosis or edema  Pulses:   2+ and symmetric all extremities  Skin:   Skin color, texture, turgor normal, no rashes or lesions  Lymph nodes:   Cervical, supraclavicular, and axillary nodes normal  Neurologic:   CNII-XII grossly intact, normal strength, sensation and reflexes throughout   .  Labs:  Lab Results  Component Value Date   WBC 3.3 (L) 06/28/2021   HGB 8.8 (L) 06/28/2021   HCT 29.0 (L) 06/28/2021   MCV 74 (L) 06/28/2021   PLT 295 06/28/2021    Lab Results  Component Value Date   CREATININE 0.59 04/15/2021   BUN 8 04/15/2021   NA 134 (L) 04/15/2021   K 3.5 04/15/2021   CL 105 04/15/2021   CO2 24 04/15/2021    Lab Results  Component Value Date   ALT 24 04/15/2021   AST 24 04/15/2021   ALKPHOS 33 (L) 04/15/2021   BILITOT 0.3 04/15/2021    No results found for: TSH   Assessment:   1.  Encounter for well woman exam with routine gynecological exam   2. ASCUS with positive high risk HPV cervical   3. Cervical cancer screening   4. Screen for STD (sexually transmitted disease)      Plan:   Shannel Comes is a 24 y.o. G84P0010 female here today for her annual exam, doing well.  Pap: 41mo cytology w/rflx today due to hx of ASCUS/HPV+ Labs: STI screening; advised patient may be less expensive at health dept. Pt requested to have done here and accepts financial responsibility.  PHQ-2 = 0 Contraception: None, declines; aware of Plan B Vaccines: Gardasil advised -- pt will seek at health dept due to finances Healthy lifestyle modifications discussed: multivitamin, diet, exercise, sunscreen. Emphasized importance of regular physical activity.  Folic Acid recommendation reviewed.  All questions answered to patient's satisfaction.   Follow up 1 yr for annual, sooner prn.    Estil Mangle, DO [facility]    [1]  Current Outpatient Medications on File Prior to Visit  Medication Sig Dispense Refill   ferrous sulfate  325 (65 FE) MG EC tablet Take 1 tablet by mouth daily with breakfast.     ibuprofen  (ADVIL ) 600 MG tablet Take 1 tablet (600 mg total) by mouth every 6 (six) hours as needed. 30 tablet 1   cetirizine (ZYRTEC) 10 MG chewable tablet Chew 10 mg by mouth daily. (Patient not taking: Reported on 03/12/2024)     CRANBERRY PO Take by mouth. (Patient not taking: Reported on 03/28/2023)     No current facility-administered medications on file prior to visit.  [2]  Allergies Allergen Reactions   Other Itching and Shortness Of Breath   Penicillins Hives    Other reaction(s): Other (See Comments) Other Reaction: Not Assessed    Latex Hives   Peanut (Diagnostic) Hives   "

## 2024-03-12 ENCOUNTER — Ambulatory Visit (INDEPENDENT_AMBULATORY_CARE_PROVIDER_SITE_OTHER): Payer: PRIVATE HEALTH INSURANCE | Admitting: Obstetrics

## 2024-03-12 ENCOUNTER — Encounter: Payer: Self-pay | Admitting: Obstetrics

## 2024-03-12 ENCOUNTER — Other Ambulatory Visit (HOSPITAL_COMMUNITY)
Admission: RE | Admit: 2024-03-12 | Discharge: 2024-03-12 | Disposition: A | Discharge status: Home / Self Care | Payer: PRIVATE HEALTH INSURANCE | Source: Ambulatory Visit | Attending: Obstetrics | Admitting: Obstetrics

## 2024-03-12 VITALS — BP 129/84 | HR 95 | Wt 143.2 lb

## 2024-03-12 DIAGNOSIS — Z01419 Encounter for gynecological examination (general) (routine) without abnormal findings: Secondary | ICD-10-CM | POA: Insufficient documentation

## 2024-03-12 DIAGNOSIS — Z113 Encounter for screening for infections with a predominantly sexual mode of transmission: Secondary | ICD-10-CM | POA: Insufficient documentation

## 2024-03-12 DIAGNOSIS — Z124 Encounter for screening for malignant neoplasm of cervix: Secondary | ICD-10-CM | POA: Diagnosis present

## 2024-03-12 DIAGNOSIS — R87810 Cervical high risk human papillomavirus (HPV) DNA test positive: Secondary | ICD-10-CM

## 2024-03-12 DIAGNOSIS — R87610 Atypical squamous cells of undetermined significance on cytologic smear of cervix (ASC-US): Secondary | ICD-10-CM

## 2024-03-13 LAB — HEP, RPR, HIV PANEL
HIV Screen 4th Generation wRfx: NONREACTIVE
Hepatitis B Surface Ag: NEGATIVE
RPR Ser Ql: NONREACTIVE

## 2024-03-16 LAB — CERVICOVAGINAL ANCILLARY ONLY
Chlamydia: NEGATIVE
Comment: NEGATIVE
Comment: NEGATIVE
Comment: NORMAL
Neisseria Gonorrhea: NEGATIVE
Trichomonas: NEGATIVE

## 2024-03-18 LAB — CYTOLOGY - PAP
Diagnosis: NEGATIVE
Diagnosis: REACTIVE

## 2024-03-20 ENCOUNTER — Ambulatory Visit: Payer: Self-pay | Admitting: Obstetrics

## 2024-03-27 IMAGING — RF DG HYSTEROGRAM
7 series · 14 of 16 positions shown · non-contrast
Comparison: Pelvic ultrasound 04/15/2021.

CLINICAL DATA: Provided history: Infertility of tubal origin.
History of ectopic pregnancy. History of chlamydia. Infertility.
Additional history provided: History of prior left salpingectomy.

EXAM:
HYSTEROSALPINGOGRAM
TECHNIQUE: A fluoroscopic hysterosalpingogram was performed by Dr. Haoven
Marjie. Please refer to Dr. Danii procedure report for further
description. Na Jah, [place] was present in the fluoroscopy room
and operator the fluoroscopy equipment.

[Series 1: fluoro_hsg_singleshot_bw · 0.17mm/px · 1 of 1 slices shown (1 of 2)]
[im 1/1]
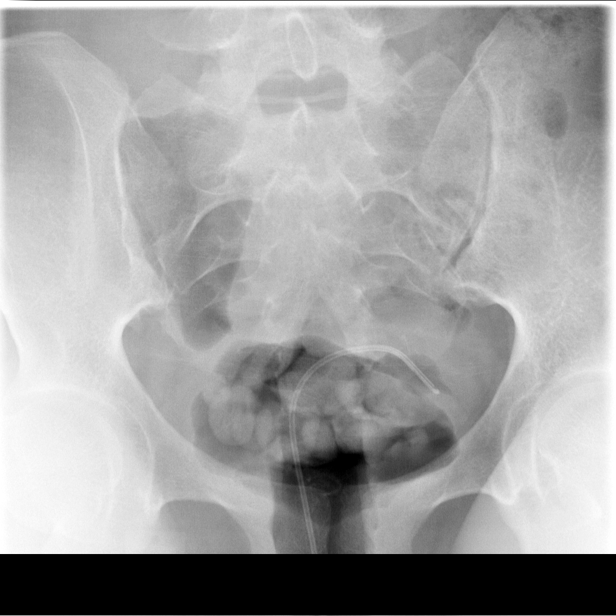

[Series 2: cp_standard · 0.17mm/px · 1 of 1 slices shown (1 of 5)]
[im 1/1]
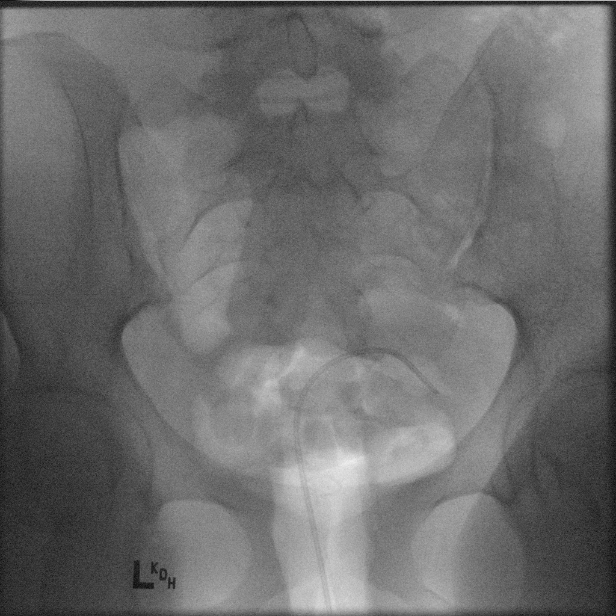

[Series 3: cp_standard · 0.17mm/px · 3 of 109 frames shown (2 of 5)]
[frame 17/109]
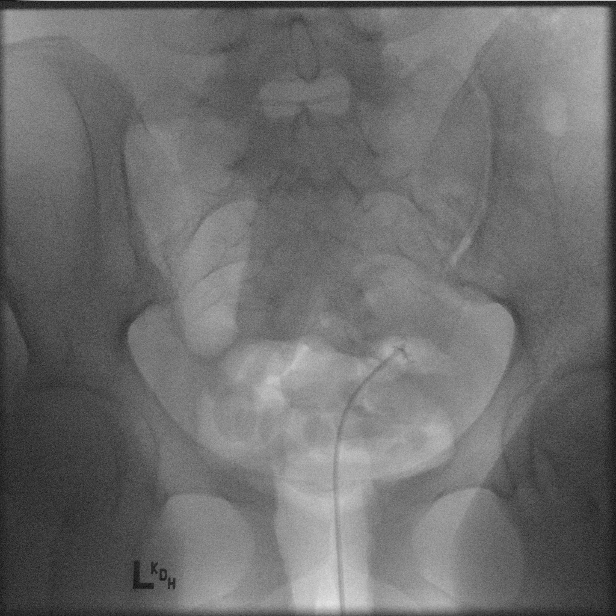
[frame 80/109]
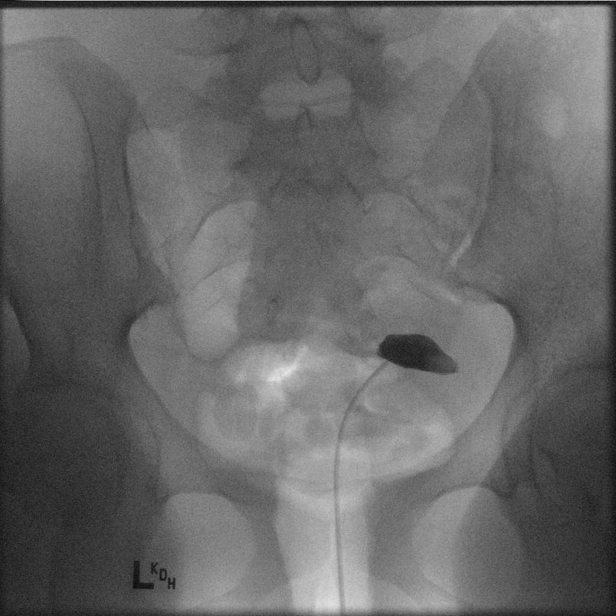
[frame 93/109]
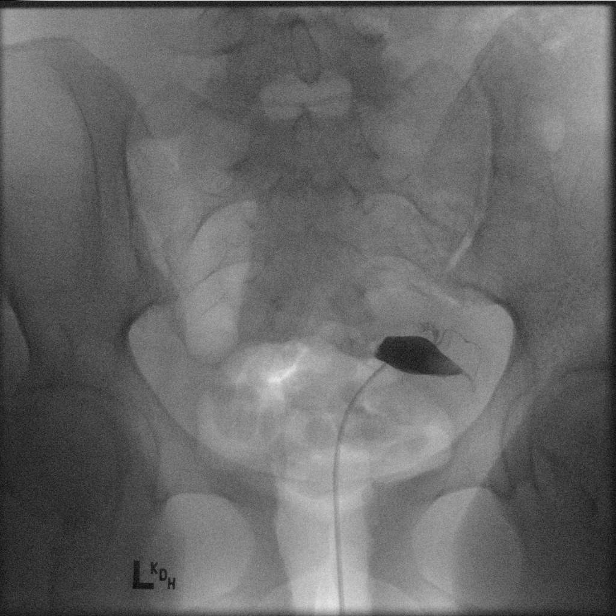

[Series 4: fluoro_hsg_singleshot_bw · 0.18mm/px · 1 of 1 slices shown (2 of 2)]
[im 1/1]
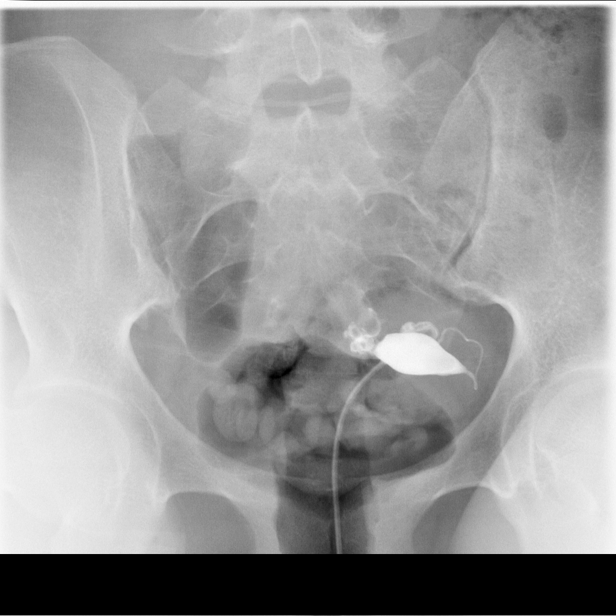

[Series 5: cp_standard · 0.18mm/px · 4 of 39 frames shown (3 of 5)]
[frame 5/39]
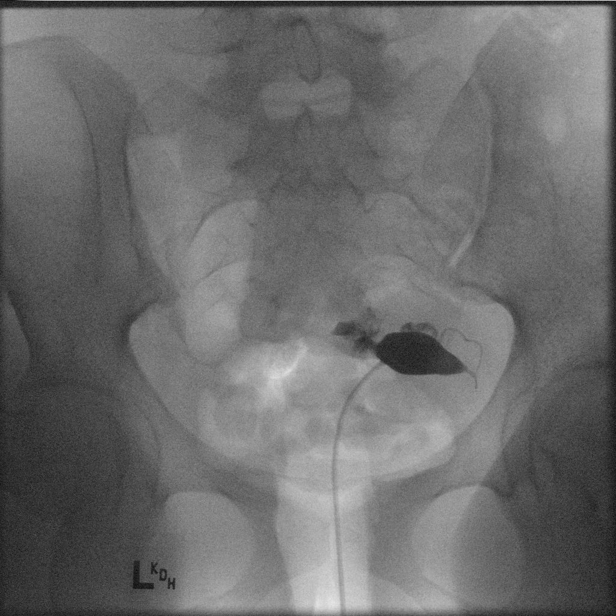
[frame 6/39]
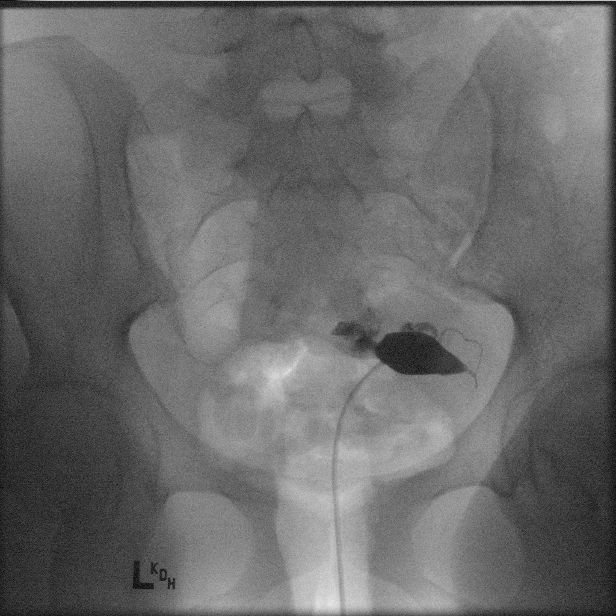
[frame 20/39]
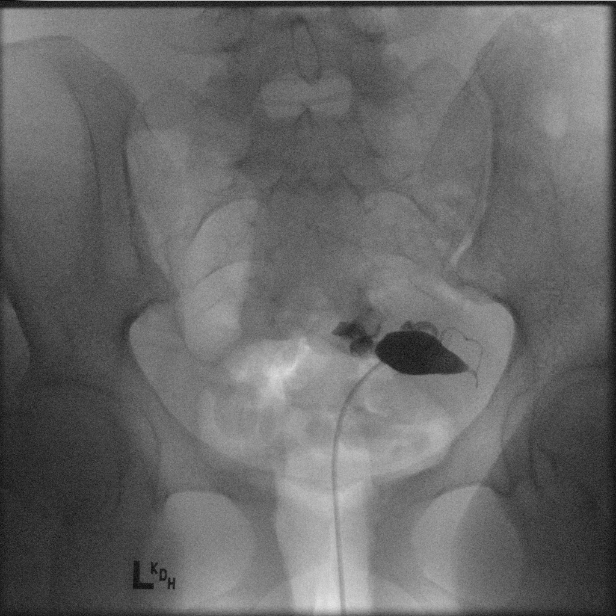
[frame 34/39]
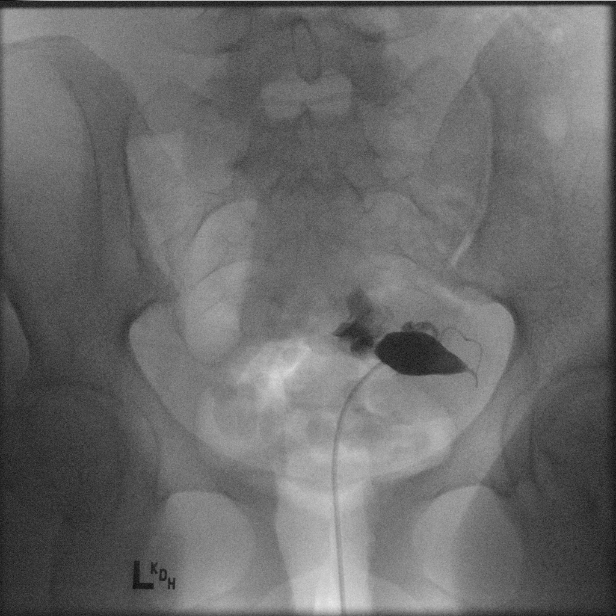

[Series 6: cp_standard · 0.18mm/px · 3 of 100 frames shown (4 of 5)]
[frame 16/100]
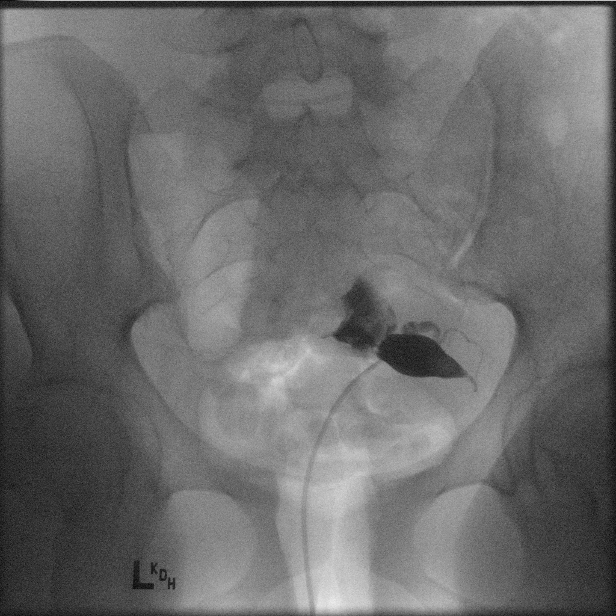
[frame 51/100]
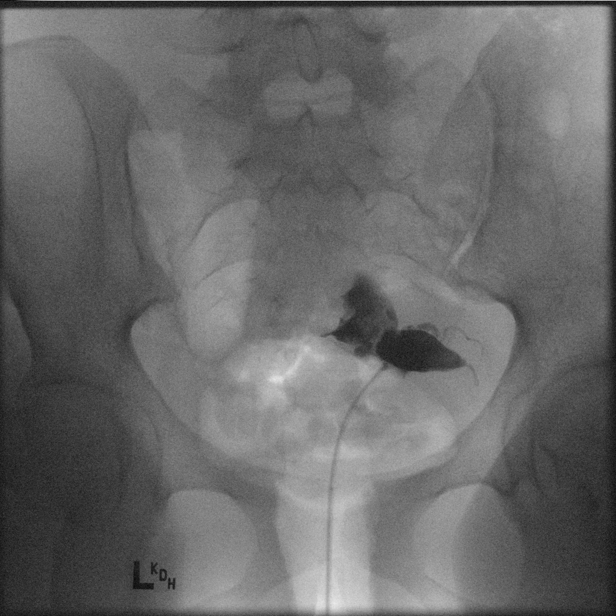
[frame 86/100]
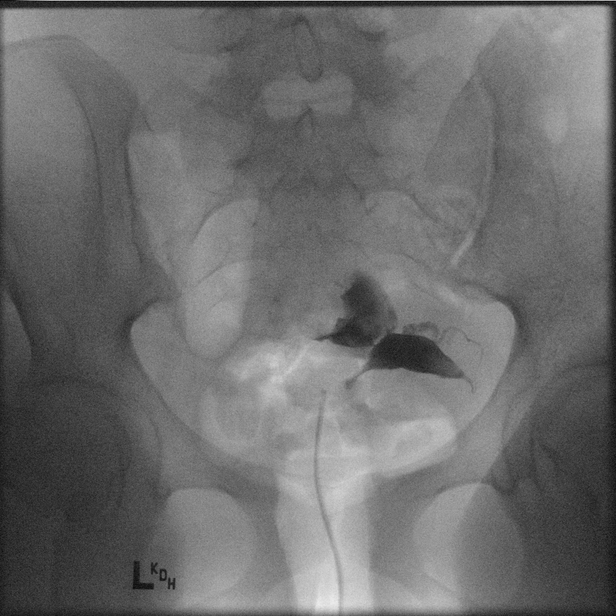

[Series 7: cp_standard · 0.18mm/px · 1 of 1 slices shown (5 of 5)]
[im 1/1]
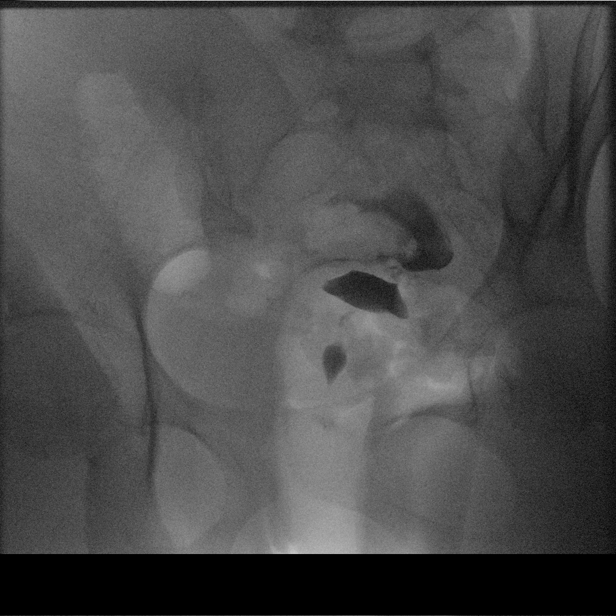

[14 of 16 positions shown; findings below may reference images not displayed]

FLUOROSCOPY:
Fluoroscopy time: 1 minute

Radiation Exposure Index (as provided by the fluoroscopic device):
7.90 mGy Kerma
FINDINGS: The endometrial cavity is normal in contour. No appreciable focal
uterine abnormality.

There is free intraperitoneal spill of contrast on the right
consistent with right fallopian tube patency.

The left fallopian tube is not visualized, and findings are
consistent with the patient's history of prior left salpingectomy.
IMPRESSION: Normal endometrial contour.

Free intraperitoneal spill of contrast on the right consistent with
right fallopian tube patency.

Nonvisualization of the left fallopian tube consistent with the
patient's history of prior left salpingectomy.
